# Patient Record
Sex: Male | Born: 1965 | Race: White | Hispanic: No | Marital: Married | Smoking: Never smoker
Health system: Southern US, Community
[De-identification: ages and names within clinical notes are randomized; demographics above are authoritative.]

## PROBLEM LIST (undated history)

## (undated) HISTORY — PX: TONSILLECTOMY: SUR1361

---

## 2011-06-08 ENCOUNTER — Emergency Department: Payer: Self-pay

## 2011-06-15 ENCOUNTER — Ambulatory Visit: Payer: Self-pay | Admitting: Internal Medicine

## 2016-05-17 ENCOUNTER — Ambulatory Visit: Payer: BLUE CROSS/BLUE SHIELD | Admitting: Anesthesiology

## 2016-05-17 ENCOUNTER — Encounter
Admission: RE | Disposition: A | Discharge status: Home / Self Care | Payer: Self-pay | Source: Ambulatory Visit | Attending: Gastroenterology

## 2016-05-17 ENCOUNTER — Encounter: Payer: Self-pay | Admitting: Anesthesiology

## 2016-05-17 ENCOUNTER — Ambulatory Visit
Admission: RE | Admit: 2016-05-17 | Discharge: 2016-05-17 | Disposition: A | Discharge status: Home / Self Care | Payer: BLUE CROSS/BLUE SHIELD | Source: Ambulatory Visit | Attending: Gastroenterology | Admitting: Gastroenterology

## 2016-05-17 DIAGNOSIS — Z1211 Encounter for screening for malignant neoplasm of colon: Secondary | ICD-10-CM | POA: Diagnosis present

## 2016-05-17 DIAGNOSIS — Z7982 Long term (current) use of aspirin: Secondary | ICD-10-CM | POA: Insufficient documentation

## 2016-05-17 DIAGNOSIS — K640 First degree hemorrhoids: Secondary | ICD-10-CM | POA: Insufficient documentation

## 2016-05-17 DIAGNOSIS — E669 Obesity, unspecified: Secondary | ICD-10-CM | POA: Diagnosis not present

## 2016-05-17 HISTORY — PX: COLONOSCOPY WITH PROPOFOL: SHX5780

## 2016-05-17 SURGERY — COLONOSCOPY WITH PROPOFOL
Anesthesia: General

## 2016-05-17 MED ORDER — SODIUM CHLORIDE 0.9 % IV SOLN: INTRAVENOUS | Status: DC

## 2016-05-17 MED ORDER — SODIUM CHLORIDE 0.9 % IV SOLN
INTRAVENOUS | Status: DC
  Administered 2016-05-17: 1000 mL via INTRAVENOUS

## 2016-05-17 MED ORDER — MIDAZOLAM HCL 2 MG/2ML IJ SOLN
INTRAMUSCULAR | Status: DC | PRN
  Administered 2016-05-17: 1 mg via INTRAVENOUS

## 2016-05-17 MED ORDER — FENTANYL CITRATE (PF) 100 MCG/2ML IJ SOLN
INTRAMUSCULAR | Status: DC | PRN
  Administered 2016-05-17: 50 ug via INTRAVENOUS

## 2016-05-17 MED ORDER — PROPOFOL 500 MG/50ML IV EMUL
INTRAVENOUS | Status: DC | PRN
  Administered 2016-05-17: 120 ug/kg/min via INTRAVENOUS

## 2016-05-17 NOTE — Anesthesia Preprocedure Evaluation (Signed)
Anesthesia Evaluation  Patient identified by MRN, date of birth, ID band Patient awake    Reviewed: Allergy & Precautions, NPO status , Patient's Chart, lab work & pertinent test results, reviewed documented beta blocker date and time   Airway Mallampati: III  TM Distance: >3 FB     Dental  (+) Chipped   Pulmonary           Cardiovascular      Neuro/Psych    GI/Hepatic   Endo/Other    Renal/GU      Musculoskeletal   Abdominal   Peds  Hematology   Anesthesia Other Findings Obese.  Reproductive/Obstetrics                             Anesthesia Physical Anesthesia Plan  ASA: II  Anesthesia Plan: General   Post-op Pain Management:    Induction: Intravenous  Airway Management Planned: Nasal Cannula  Additional Equipment:   Intra-op Plan:   Post-operative Plan:   Informed Consent: I have reviewed the patients History and Physical, chart, labs and discussed the procedure including the risks, benefits and alternatives for the proposed anesthesia with the patient or authorized representative who has indicated his/her understanding and acceptance.     Plan Discussed with: CRNA  Anesthesia Plan Comments:         Anesthesia Quick Evaluation

## 2016-05-17 NOTE — Transfer of Care (Signed)
Immediate Anesthesia Transfer of Care Note  Patient: Hector PapRonald D Wingrove Jr.  Procedure(s) Performed: Procedure(s): COLONOSCOPY WITH PROPOFOL (N/A)  Patient Location: PACU  Anesthesia Type:General  Level of Consciousness: awake and sedated  Airway & Oxygen Therapy: Patient Spontanous Breathing and Patient connected to nasal cannula oxygen  Post-op Assessment: Report given to RN and Post -op Vital signs reviewed and stable  Post vital signs: Reviewed  Last Vitals:  Filed Vitals:   05/17/16 0738  BP: 128/89  Pulse: 71  Temp: 35.9 C  Resp: 16    Last Pain: There were no vitals filed for this visit.       Complications: No apparent anesthesia complications

## 2016-05-17 NOTE — H&P (Signed)
Outpatient short stay form Pre-procedure 05/17/2016 8:42 AM Christena DeemMartin U Kaysen Sefcik MD  Primary Physician: Dr. Windle GuardWilson Elkins  Reason for visit:  Colonoscopy  History of present illness:  Patient is a 50 year old male presenting today for colon cancer screening. He's never had a colonoscopy before. He takes no current aspirin products. He takes no blood thinning agents. He tolerated his prep well.    Current facility-administered medications:  .  0.9 %  sodium chloride infusion, , Intravenous, Continuous, Christena DeemMartin U Mihailo Sage, MD, Last Rate: 20 mL/hr at 05/17/16 0749, 1,000 mL at 05/17/16 0749 .  0.9 %  sodium chloride infusion, , Intravenous, Continuous, Christena DeemMartin U Kyriana Yankee, MD  Prescriptions prior to admission  Medication Sig Dispense Refill Last Dose  . aspirin (ASPIRIN EC) 81 MG EC tablet Take 81 mg by mouth daily. Swallow whole.   Past Week at Unknown time  . Multiple Vitamin (MULTIVITAMIN) tablet Take 1 tablet by mouth daily.   Past Week at Unknown time     No Known Allergies   History reviewed. No pertinent past medical history.  Review of systems:      Physical Exam    Heart and lungs: Regular rate and rhythm without rub or gallop, lungs are bilaterally clear.    HEENT: Normocephalic atraumatic eyes are anicteric    Other:     Pertinant exam for procedure: Soft nontender nondistended bowel sounds positive normoactive.    Planned proceedures: Colonoscopy and indicated procedures. I have discussed the risks benefits and complications of procedures to include not limited to bleeding, infection, perforation and the risk of sedation and the patient wishes to proceed.    Christena DeemMartin U Jimmy Plessinger, MD Gastroenterology 05/17/2016  8:42 AM

## 2016-05-17 NOTE — Anesthesia Procedure Notes (Signed)
Performed by: COOK-MARTIN, Dima Ferrufino Pre-anesthesia Checklist: Patient identified, Emergency Drugs available, Suction available, Patient being monitored and Timeout performed Patient Re-evaluated:Patient Re-evaluated prior to inductionOxygen Delivery Method: Nasal cannula Preoxygenation: Pre-oxygenation with 100% oxygen Intubation Type: IV induction Placement Confirmation: CO2 detector and positive ETCO2       

## 2016-05-17 NOTE — Anesthesia Postprocedure Evaluation (Signed)
Anesthesia Post Note  Patient: Berniece PapRonald D Nephew Jr.  Procedure(s) Performed: Procedure(s) (LRB): COLONOSCOPY WITH PROPOFOL (N/A)  Patient location during evaluation: Endoscopy Anesthesia Type: General Level of consciousness: awake and alert Pain management: pain level controlled Vital Signs Assessment: post-procedure vital signs reviewed and stable Respiratory status: spontaneous breathing, nonlabored ventilation, respiratory function stable and patient connected to nasal cannula oxygen Cardiovascular status: blood pressure returned to baseline and stable Postop Assessment: no signs of nausea or vomiting Anesthetic complications: no    Last Vitals:  Filed Vitals:   05/17/16 0930 05/17/16 0940  BP: 121/86 120/93  Pulse: 71 57  Temp:    Resp: 16 16    Last Pain: There were no vitals filed for this visit.               Tighe Gitto S

## 2016-05-17 NOTE — Op Note (Signed)
[facility] Gastroenterology Patient Name: Gaylan Fauver Procedure Date: 05/17/2016 8:45 AM MRN: [id number] Account #: [id number] Date of Birth: 1966-02-09 Admit Type: Outpatient Age: 50 Room: [facility] ENDO ROOM 3 Gender: Male Note Status: Finalized Procedure:            Colonoscopy Indications:          Screening for colorectal malignant neoplasm, This is                        the patient's first colonoscopy Providers:            Christena Deem, MD Referring MD:         Kaleen Mask (Referring MD) Medicines:            Monitored Anesthesia Care Complications:        No immediate complications. Procedure:            Pre-Anesthesia Assessment:                       - ASA Grade Assessment: II - A patient with mild                        systemic disease.                       After obtaining informed consent, the colonoscope was                        passed under direct vision. Throughout the procedure,                        the patient's blood pressure, pulse, and oxygen                        saturations were monitored continuously. The                        Colonoscope was introduced through the anus and                        advanced to the the cecum, identified by appendiceal                        orifice and ileocecal valve. The colonoscopy was                        performed without difficulty. The patient tolerated the                        procedure well. The quality of the bowel preparation                        was good. Findings:      The colon (entire examined portion) appeared normal.      Non-bleeding internal hemorrhoids were found during retroflexion. The       hemorrhoids were small and Grade I (internal hemorrhoids that do not       prolapse).      The digital rectal exam was normal. Impression:           - The entire examined colon is normal.                       -  Non-bleeding internal hemorrhoids.                       - No  specimens collected. Recommendation:       - Discharge patient to home. Procedure Code(s):    --- Professional ---                       [phone], Colonoscopy, flexible; diagnostic, including                        collection of specimen(s) by brushing or washing, when                        performed (separate procedure) Diagnosis Code(s):    --- Professional ---                       Z12.11, Encounter for screening for malignant neoplasm                        of colon                       K64.0, First degree hemorrhoids CPT copyright 2016 American Medical Association. All rights reserved. The codes documented in this report are preliminary and upon coder review may  be revised to meet current compliance requirements. Christena DeemMartin U Skulskie, MD 05/17/2016 9:08:16 AM This report has been signed electronically. Number of Addenda: 0 Note Initiated On: 05/17/2016 8:45 AM Scope Withdrawal Time: 0 hours 6 minutes 48 seconds  Total Procedure Duration: 0 hours 15 minutes 44 seconds       [facility]

## 2016-05-18 ENCOUNTER — Encounter: Payer: Self-pay | Admitting: Gastroenterology

## 2016-07-01 ENCOUNTER — Encounter: Payer: Self-pay | Admitting: Sports Medicine

## 2016-07-01 ENCOUNTER — Ambulatory Visit (INDEPENDENT_AMBULATORY_CARE_PROVIDER_SITE_OTHER): Payer: Managed Care, Other (non HMO) | Admitting: Sports Medicine

## 2016-07-01 DIAGNOSIS — B351 Tinea unguium: Secondary | ICD-10-CM | POA: Diagnosis not present

## 2016-07-01 DIAGNOSIS — M79674 Pain in right toe(s): Secondary | ICD-10-CM

## 2016-07-01 DIAGNOSIS — L600 Ingrowing nail: Secondary | ICD-10-CM | POA: Diagnosis not present

## 2016-07-01 NOTE — Patient Instructions (Signed)

## 2016-07-01 NOTE — Progress Notes (Signed)
Patient ID: Berniece Pap., male   DOB: [birth year], 50 y.o.   MRN: [id number] Subjective: Tishawn Friedhoff. is a 50 y.o. male patient presents to office today complaining of a painful incurvated, swollen medial and lateral nail borders of the first toe on the Right foot. This has been present for a few weeks; reports that his PCP treated him for nail fungus with oral Lamisil and since nail has started to grow in skin and hurt. Patient denies fever/chills/nausea/vomitting/any other related constitutional symptoms at this time.  There are no active problems to display for this patient.   Current Outpatient Prescriptions on File Prior to Visit  Medication Sig Dispense Refill  . aspirin (ASPIRIN EC) 81 MG EC tablet Take 81 mg by mouth daily. Swallow whole.    . Multiple Vitamin (MULTIVITAMIN) tablet Take 1 tablet by mouth daily.     No current facility-administered medications on file prior to visit.    No Known Allergies  Objective:  There were no vitals filed for this visit.  General: Well developed, nourished, in no acute distress, alert and oriented x3   Dermatology: Skin is warm, dry and supple bilateral. RIght hallux nail appears to be  severely incurvated with hyperkeratosis formation at the distal aspects of  the medial and lateral nail borders. (-) Erythema. (+) Edema. (-) serosanguous  drainage present. The remaining nails appear unremarkable at this time, free from acute ingrowing. There are no open sores, lesions or other signs of infection present.  Vascular: Dorsalis Pedis artery and Posterior Tibial artery pedal pulses are 2/4 bilateral with immedate capillary fill time. Pedal hair growth present. No lower extremity edema.   Neruologic: Grossly intact via light touch bilateral.  Musculoskeletal: Tenderness to palpation of the Right hallux medial and lateral nail fold(s). Muscular strength within normal limits in all groups bilateral.   Assesement and Plan: Problem List  Items Addressed This Visit    None    Visit Diagnoses    Ingrown nail    -  Primary    Toe pain, right        Nail fungus        Previously treated with Lamisil last dose March 2017    Relevant Medications    terbinafine (LAMISIL) 250 MG tablet      -Discussed treatment alternatives and plan of care; Explained permanent/temporary nail avulsion and post procedure course to patient. Patient opt for PNA Right hallux medial and lateral nail folds. - After a verbal consent, injected 6 ml of a 50:50 mixture of 2% plain  lidocaine and 0.5% plain marcaine in a normal hallux block fashion. Next, a  betadine prep was performed. Anesthesia was tested and found to be appropriate.  The offending Right hallux medial and lateral nail borders were then incised from the hyponychium to the epinychium. The offending nail borders were removed and cleared from the field. The areas were curretted for any remaining nail or spicules. Phenol application performed and the area was then flushed with alcohol and dressed with antibiotic cream and a dry sterile dressing. -Patient was instructed to leave the dressing intact for today and begin soaking  in a weak solution of betadine and water tomorrow. Patient was instructed to  soak for 15 minutes each day and apply neosporin and a gauze or bandaid dressing each day. -Patient was instructed to monitor the toe for signs of infection and return to office if toe becomes red, hot or swollen. -Advised ice, elevation, and  tylenol or motrin if needed for pain.  -Patient is to return in 1 week for follow up care or sooner if problems arise.  Asencion Islamitorya Kalonji Zurawski, DPM

## 2016-07-15 ENCOUNTER — Ambulatory Visit (INDEPENDENT_AMBULATORY_CARE_PROVIDER_SITE_OTHER): Payer: Managed Care, Other (non HMO) | Admitting: Sports Medicine

## 2016-07-15 ENCOUNTER — Encounter: Payer: Self-pay | Admitting: Sports Medicine

## 2016-07-15 DIAGNOSIS — Z9889 Other specified postprocedural states: Secondary | ICD-10-CM

## 2016-07-15 DIAGNOSIS — M79674 Pain in right toe(s): Secondary | ICD-10-CM

## 2016-07-15 NOTE — Patient Instructions (Signed)

## 2016-07-15 NOTE — Progress Notes (Signed)
Subjective: Hector Anderson. is a 50 y.o. male patient returns to office today for follow up evaluation after having Right Hallux medial and lateral permanent nail avulsions performed on 07-01-16. Patient has been soaking using betadine and applying topical antibiotic covered with bandaid daily. Patient denies fever/chills/nausea/vomitting/any other related constitutional symptoms at this time.  There are no active problems to display for this patient.   Current Outpatient Prescriptions on File Prior to Visit  Medication Sig Dispense Refill  . aspirin (ASPIRIN EC) 81 MG EC tablet Take 81 mg by mouth daily. Swallow whole.    . Multiple Vitamin (MULTIVITAMIN) tablet Take 1 tablet by mouth daily.    Marland Kitchen omeprazole (PRILOSEC) 20 MG capsule Take by mouth.    . Red Yeast Rice Extract 600 MG CAPS Take by mouth.    . terbinafine (LAMISIL) 250 MG tablet Take by mouth.     No current facility-administered medications on file prior to visit.     No Known Allergies  Objective:  General: Well developed, nourished, in no acute distress, alert and oriented x3   Dermatology: Skin is warm, dry and supple bilateral. Right hallux medial and lateral nail beds appears to be clean, dry, with mild granular tissue and surrounding eschar/scab. (-) Erythema. (-) Edema. (-) serosanguous drainage present. The remaining nails appear unremarkable at this time, free from ingrowing. There are no other lesions or other signs of infection present.  Neurovascular status: Intact. No lower extremity swelling; No pain with calf compression bilateral.  Musculoskeletal: Decreased tenderness to palpation of the Right hallux nail fold(s). Muscular strength within normal limits bilateral.   Assesement and Plan: Problem List Items Addressed This Visit    None    Visit Diagnoses    S/P nail surgery    -  Primary   Toe pain, right          -Examined patient  -Cleansed right hallux medial and lateral nail folds and gently  scrubbed with peroxide and q-tip/curetted away eschar at site and applied antibiotic cream covered with bandaid.  -Discussed plan of care with patient. -Patient to now begin soaking in a weak solution of Epsom salt and warm water. Patient was instructed to soak for 15-20 minutes each day until the toe appears normal and there is no drainage, redness, tenderness, or swelling at the procedure site, and apply neosporin and a gauze or bandaid dressing each day as needed. May leave open to air at night. -Educated patient on long term care after nail surgery. -Patient was instructed to monitor the toe for reoccurrence and signs of infection; Patient advised to return to office or go to ER if toe becomes red, hot or swollen. -Patient is to return as needed or sooner if problems arise.  Asencion Islam, DPM

## 2017-02-01 ENCOUNTER — Other Ambulatory Visit (HOSPITAL_COMMUNITY): Payer: Self-pay | Admitting: Family Medicine

## 2017-02-01 DIAGNOSIS — R1319 Other dysphagia: Secondary | ICD-10-CM

## 2017-02-02 ENCOUNTER — Ambulatory Visit (HOSPITAL_COMMUNITY)
Admission: RE | Admit: 2017-02-02 | Discharge: 2017-02-02 | Disposition: A | Discharge status: Home / Self Care | Payer: Managed Care, Other (non HMO) | Source: Ambulatory Visit | Attending: Family Medicine | Admitting: Family Medicine

## 2017-02-02 ENCOUNTER — Ambulatory Visit (HOSPITAL_COMMUNITY): Payer: BLUE CROSS/BLUE SHIELD

## 2017-02-02 DIAGNOSIS — R1319 Other dysphagia: Secondary | ICD-10-CM

## 2017-02-10 ENCOUNTER — Other Ambulatory Visit: Payer: Self-pay | Admitting: Family Medicine

## 2017-02-28 ENCOUNTER — Other Ambulatory Visit: Payer: Self-pay | Admitting: Family Medicine

## 2017-02-28 DIAGNOSIS — R1310 Dysphagia, unspecified: Secondary | ICD-10-CM

## 2017-03-07 ENCOUNTER — Ambulatory Visit
Admission: RE | Admit: 2017-03-07 | Discharge: 2017-03-07 | Disposition: A | Discharge status: Home / Self Care | Payer: Managed Care, Other (non HMO) | Source: Ambulatory Visit | Attending: Family Medicine | Admitting: Family Medicine

## 2017-03-07 DIAGNOSIS — R1310 Dysphagia, unspecified: Secondary | ICD-10-CM

## 2018-03-06 ENCOUNTER — Ambulatory Visit: Admission: RE | Admit: 2018-03-06 | Payer: 59 | Source: Ambulatory Visit | Admitting: Internal Medicine

## 2018-03-06 ENCOUNTER — Encounter: Admission: RE | Payer: Self-pay | Source: Ambulatory Visit

## 2018-03-06 SURGERY — ESOPHAGOGASTRODUODENOSCOPY (EGD) WITH PROPOFOL
Anesthesia: General

## 2018-04-02 ENCOUNTER — Other Ambulatory Visit
Admission: RE | Admit: 2018-04-02 | Discharge: 2018-04-02 | Disposition: A | Discharge status: Home / Self Care | Payer: 59 | Source: Ambulatory Visit | Attending: Gastroenterology | Admitting: Gastroenterology

## 2018-04-02 DIAGNOSIS — R197 Diarrhea, unspecified: Secondary | ICD-10-CM | POA: Insufficient documentation

## 2018-04-02 DIAGNOSIS — J312 Chronic pharyngitis: Secondary | ICD-10-CM | POA: Diagnosis present

## 2018-04-02 LAB — GASTROINTESTINAL PANEL BY PCR, STOOL (REPLACES STOOL CULTURE)
ADENOVIRUS F40/41: NOT DETECTED
Astrovirus: NOT DETECTED
CAMPYLOBACTER SPECIES: NOT DETECTED
CYCLOSPORA CAYETANENSIS: NOT DETECTED
Cryptosporidium: NOT DETECTED
ENTEROAGGREGATIVE E COLI (EAEC): DETECTED — AB
ENTEROPATHOGENIC E COLI (EPEC): NOT DETECTED
ENTEROTOXIGENIC E COLI (ETEC): NOT DETECTED
Entamoeba histolytica: NOT DETECTED
GIARDIA LAMBLIA: NOT DETECTED
Norovirus GI/GII: NOT DETECTED
PLESIMONAS SHIGELLOIDES: NOT DETECTED
Rotavirus A: NOT DETECTED
Salmonella species: NOT DETECTED
Sapovirus (I, II, IV, and V): NOT DETECTED
Shiga like toxin producing E coli (STEC): NOT DETECTED
Shigella/Enteroinvasive E coli (EIEC): NOT DETECTED
VIBRIO SPECIES: NOT DETECTED
Vibrio cholerae: NOT DETECTED
YERSINIA ENTEROCOLITICA: NOT DETECTED

## 2018-04-02 LAB — C DIFFICILE QUICK SCREEN W PCR REFLEX
C DIFFICILE (CDIFF) INTERP: NOT DETECTED
C Diff antigen: NEGATIVE
C Diff toxin: NEGATIVE

## 2018-05-29 ENCOUNTER — Ambulatory Visit
Admission: RE | Admit: 2018-05-29 | Discharge: 2018-05-29 | Disposition: A | Discharge status: Home / Self Care | Payer: 59 | Source: Ambulatory Visit | Attending: Internal Medicine | Admitting: Internal Medicine

## 2018-05-29 ENCOUNTER — Ambulatory Visit: Payer: 59 | Admitting: Anesthesiology

## 2018-05-29 ENCOUNTER — Encounter: Payer: Self-pay | Admitting: *Deleted

## 2018-05-29 ENCOUNTER — Encounter
Admission: RE | Disposition: A | Discharge status: Home / Self Care | Payer: Self-pay | Source: Ambulatory Visit | Attending: Internal Medicine

## 2018-05-29 DIAGNOSIS — Z6839 Body mass index (BMI) 39.0-39.9, adult: Secondary | ICD-10-CM | POA: Insufficient documentation

## 2018-05-29 DIAGNOSIS — K219 Gastro-esophageal reflux disease without esophagitis: Secondary | ICD-10-CM | POA: Insufficient documentation

## 2018-05-29 DIAGNOSIS — Z79899 Other long term (current) drug therapy: Secondary | ICD-10-CM | POA: Diagnosis not present

## 2018-05-29 DIAGNOSIS — R1310 Dysphagia, unspecified: Secondary | ICD-10-CM | POA: Diagnosis not present

## 2018-05-29 DIAGNOSIS — Z7982 Long term (current) use of aspirin: Secondary | ICD-10-CM | POA: Diagnosis not present

## 2018-05-29 DIAGNOSIS — J029 Acute pharyngitis, unspecified: Secondary | ICD-10-CM | POA: Insufficient documentation

## 2018-05-29 DIAGNOSIS — K2950 Unspecified chronic gastritis without bleeding: Secondary | ICD-10-CM | POA: Diagnosis not present

## 2018-05-29 HISTORY — PX: ESOPHAGOGASTRODUODENOSCOPY (EGD) WITH PROPOFOL: SHX5813

## 2018-05-29 SURGERY — ESOPHAGOGASTRODUODENOSCOPY (EGD) WITH PROPOFOL
Anesthesia: General

## 2018-05-29 MED ORDER — PROPOFOL 500 MG/50ML IV EMUL
INTRAVENOUS | Status: AC
  Filled 2018-05-29: qty 50

## 2018-05-29 MED ORDER — MIDAZOLAM HCL 2 MG/2ML IJ SOLN
INTRAMUSCULAR | Status: DC | PRN
  Administered 2018-05-29: 2 mg via INTRAVENOUS

## 2018-05-29 MED ORDER — SODIUM CHLORIDE 0.9 % IV SOLN
INTRAVENOUS | Status: DC
  Administered 2018-05-29: 1000 mL via INTRAVENOUS

## 2018-05-29 MED ORDER — LIDOCAINE HCL (PF) 2 % IJ SOLN
INTRAMUSCULAR | Status: AC
  Filled 2018-05-29: qty 10

## 2018-05-29 MED ORDER — LIDOCAINE HCL (CARDIAC) PF 100 MG/5ML IV SOSY
PREFILLED_SYRINGE | INTRAVENOUS | Status: DC | PRN
  Administered 2018-05-29: 30 mg via INTRAVENOUS

## 2018-05-29 MED ORDER — FENTANYL CITRATE (PF) 100 MCG/2ML IJ SOLN
INTRAMUSCULAR | Status: AC
  Filled 2018-05-29: qty 2

## 2018-05-29 MED ORDER — FENTANYL CITRATE (PF) 100 MCG/2ML IJ SOLN
INTRAMUSCULAR | Status: DC | PRN
  Administered 2018-05-29 (×2): 25 ug via INTRAVENOUS
  Administered 2018-05-29: 50 ug via INTRAVENOUS

## 2018-05-29 MED ORDER — PROPOFOL 500 MG/50ML IV EMUL
INTRAVENOUS | Status: DC | PRN
  Administered 2018-05-29: 120 ug/kg/min via INTRAVENOUS

## 2018-05-29 MED ORDER — MIDAZOLAM HCL 2 MG/2ML IJ SOLN
INTRAMUSCULAR | Status: AC
  Filled 2018-05-29: qty 2

## 2018-05-29 NOTE — Interval H&P Note (Signed)
History and Physical Interval Note:  05/29/2018 12:09 PM  Hector Paponald D Fiumara Jr.  has presented today for surgery, with the diagnosis of GERD  The various methods of treatment have been discussed with the patient and family. After consideration of risks, benefits and other options for treatment, the patient has consented to  Procedure(s): ESOPHAGOGASTRODUODENOSCOPY (EGD) WITH PROPOFOL (N/A) as a surgical intervention .  The patient's history has been reviewed, patient examined, no change in status, stable for surgery.  I have reviewed the patient's chart and labs.  Questions were answered to the patient's satisfaction.     Sourisoledo, Chelseaeodoro

## 2018-05-29 NOTE — Op Note (Signed)
[facility] Gastroenterology Patient Name: Hector Anderson Procedure Date: 05/29/2018 11:46 AM MRN: [id number] Account #: [id number] Date of Birth: 10-Jun-1966 Admit Type: Outpatient Age: 52 Room: [facility] ENDO ROOM 2 Gender: Male Note Status: Finalized Procedure:            Upper GI endoscopy Indications:          Dysphagia, Esophageal reflux, Sore throat Providers:            Boykin Nearing. Norma Fredrickson MD, MD Referring MD:         Kaleen Mask (Referring MD) Medicines:            Propofol per Anesthesia Complications:        No immediate complications. Procedure:            Pre-Anesthesia Assessment:                       - The risks and benefits of the procedure and the                        sedation options and risks were discussed with the                        patient. All questions were answered and informed                        consent was obtained.                       - Patient identification and proposed procedure were                        verified prior to the procedure by the nurse. The                        procedure was verified in the procedure room.                       - ASA Grade Assessment: III - A patient with severe                        systemic disease.                       - After reviewing the risks and benefits, the patient                        was deemed in satisfactory condition to undergo the                        procedure.                       After obtaining informed consent, the endoscope was                        passed under direct vision. Throughout the procedure,                        the patient's blood pressure, pulse, and oxygen  saturations were monitored continuously. The Endoscope                        was introduced through the mouth, and advanced to the                        third part of duodenum. The upper GI endoscopy was                        accomplished without difficulty. The  patient tolerated                        the procedure well. Findings:      The Z-line was irregular and was found at the gastroesophageal junction.       Mucosa was biopsied with a cold forceps for histology. One specimen       bottle was sent to pathology. Biopsies were obtained from the proximal       and distal esophagus with cold forceps for histology of suspected       eosinophilic esophagitis.      The entire examined stomach was normal.      The examined duodenum was normal.      The exam was otherwise without abnormality. Impression:           - Z-line irregular, at the gastroesophageal junction.                        Biopsied.                       - Normal stomach.                       - Normal examined duodenum.                       - The examination was otherwise normal. Recommendation:       - Patient has a contact number available for                        emergencies. The signs and symptoms of potential                        delayed complications were discussed with the patient.                        Return to normal activities tomorrow. Written discharge                        instructions were provided to the patient.                       - Resume previous diet.                       - Continue present medications.                       - Await pathology results.                       - Return to physician assistant in 3 months.                       -  The findings and recommendations were discussed with                        the patient and their spouse. Procedure Code(s):    --- Professional ---                       [phone], Esophagogastroduodenoscopy, flexible, transoral;                        with biopsy, single or multiple Diagnosis Code(s):    --- Professional ---                       J02.9, Acute pharyngitis, unspecified                       K21.9, Gastro-esophageal reflux disease without                        esophagitis                        R13.10, Dysphagia, unspecified                       K22.8, Other specified diseases of esophagus CPT copyright 2017 American Medical Association. All rights reserved. The codes documented in this report are preliminary and upon coder review may  be revised to meet current compliance requirements. Stanton Kidney MD, MD 05/29/2018 1:17:11 PM This report has been signed electronically. Number of Addenda: 0 Note Initiated On: 05/29/2018 11:46 AM      [facility]

## 2018-05-29 NOTE — H&P (Signed)
Outpatient short stay form Pre-procedure 05/29/2018 11:57 AM Exavior Kimmons K. Norma Fredricksonoledo, M.D.  Primary Physician: Windle GuardWilson Elkins, M.D.  Reason for visit: Dysphagia, GERD.  History of present illness:  Patient with a history of GERD c/o nondescript dysphagia. No hemetemesis or abdominal pain.   No current facility-administered medications for this encounter.   Medications Prior to Admission  Medication Sig Dispense Refill Last Dose  . Misc Natural Products (OSTEO BI-FLEX JOINT SHIELD PO) Take by mouth daily.     Marland Kitchen. aspirin (ASPIRIN EC) 81 MG EC tablet Take 81 mg by mouth daily. Swallow whole.   Past Week at Unknown time  . Multiple Vitamin (MULTIVITAMIN) tablet Take 1 tablet by mouth daily.   Past Week at Unknown time  . omeprazole (PRILOSEC) 20 MG capsule Take by mouth.     . Red Yeast Rice Extract 600 MG CAPS Take by mouth.     . terbinafine (LAMISIL) 250 MG tablet Take by mouth.        No Known Allergies   No past medical history on file.  Review of systems:  Otherwise negative.    Physical Exam  Gen: Alert, oriented. Appears stated age.  HEENT: [place]/AT. PERRLA. Lungs: CTA, no wheezes. CV: RR nl S1, S2. Abd: soft, benign, no masses. BS+ Ext: No edema. Pulses 2+    Planned procedures: Proceed with colonoscopy. The patient understands the nature of the planned procedure, indications, risks, alternatives and potential complications including but not limited to bleeding, infection, perforation, damage to internal organs and possible oversedation/side effects from anesthesia. The patient agrees and gives consent to proceed.  Please refer to procedure notes for findings, recommendations and patient disposition/instructions.     Lucy Woolever K. Norma Fredricksonoledo, M.D. Gastroenterology 05/29/2018  11:57 AM

## 2018-05-29 NOTE — Anesthesia Post-op Follow-up Note (Signed)
Anesthesia QCDR form completed.        

## 2018-05-29 NOTE — Anesthesia Preprocedure Evaluation (Signed)
Anesthesia Evaluation  Patient identified by MRN, date of birth, ID band Patient awake    Reviewed: Allergy & Precautions, H&P , NPO status , Patient's Chart, lab work & pertinent test results, reviewed documented beta blocker date and time   Airway Mallampati: II   Neck ROM: full    Dental  (+) Poor Dentition   Pulmonary neg pulmonary ROS,    Pulmonary exam normal        Cardiovascular negative cardio ROS Normal cardiovascular exam Rhythm:regular Rate:Normal     Neuro/Psych negative neurological ROS  negative psych ROS   GI/Hepatic negative GI ROS, Neg liver ROS,   Endo/Other  negative endocrine ROSMorbid obesity  Renal/GU negative Renal ROS  negative genitourinary   Musculoskeletal   Abdominal   Peds  Hematology negative hematology ROS (+)   Anesthesia Other Findings History reviewed. No pertinent past medical history. Past Surgical History: 05/17/2016: COLONOSCOPY WITH PROPOFOL; N/A     Comment:  Procedure: COLONOSCOPY WITH PROPOFOL;  Surgeon: Christena DeemMartin U              Skulskie, MD;  [facility];  Service:               Endoscopy;  Laterality: N/A; No date: TONSILLECTOMY BMI    Body Mass Index:  39.13 kg/m     Reproductive/Obstetrics negative OB ROS                             Anesthesia Physical Anesthesia Plan  ASA: III  Anesthesia Plan: General   Post-op Pain Management:    Induction:   PONV Risk Score and Plan:   Airway Management Planned:   Additional Equipment:   Intra-op Plan:   Post-operative Plan:   Informed Consent: I have reviewed the patients History and Physical, chart, labs and discussed the procedure including the risks, benefits and alternatives for the proposed anesthesia with the patient or authorized representative who has indicated his/her understanding and acceptance.   Dental Advisory Given  Plan Discussed with: CRNA  Anesthesia  Plan Comments:         Anesthesia Quick Evaluation

## 2018-05-29 NOTE — Anesthesia Procedure Notes (Signed)
Performed by: Cook-Martin, Elta Angell Pre-anesthesia Checklist: Patient identified, Emergency Drugs available, Suction available, Patient being monitored and Timeout performed Patient Re-evaluated:Patient Re-evaluated prior to induction Oxygen Delivery Method: Nasal cannula Preoxygenation: Pre-oxygenation with 100% oxygen Induction Type: IV induction Airway Equipment and Method: Bite block Placement Confirmation: CO2 detector and positive ETCO2       

## 2018-05-29 NOTE — Transfer of Care (Signed)
Immediate Anesthesia Transfer of Care Note  Patient: Hector PapRonald D Sazama Jr.  Procedure(s) Performed: ESOPHAGOGASTRODUODENOSCOPY (EGD) WITH PROPOFOL (N/A )  Patient Location: PACU  Anesthesia Type:General  Level of Consciousness: awake and sedated  Airway & Oxygen Therapy: Patient Spontanous Breathing and Patient connected to nasal cannula oxygen  Post-op Assessment: Report given to RN and Post -op Vital signs reviewed and stable  Post vital signs: Reviewed and stable  Last Vitals:  Vitals Value Taken Time  BP    Temp    Pulse    Resp    SpO2      Last Pain:  Vitals:   05/29/18 1221  TempSrc: Tympanic  PainSc: 3       Patients Stated Pain Goal: 0 (05/29/18 1221)  Complications: No apparent anesthesia complications

## 2018-05-30 ENCOUNTER — Encounter: Payer: Self-pay | Admitting: Internal Medicine

## 2018-05-30 NOTE — Anesthesia Postprocedure Evaluation (Signed)
Anesthesia Post Note  Patient: Hector PapRonald D Shishido Jr.  Procedure(s) Performed: ESOPHAGOGASTRODUODENOSCOPY (EGD) WITH PROPOFOL (N/A )  Patient location during evaluation: Endoscopy Anesthesia Type: General Level of consciousness: awake and alert Pain management: pain level controlled Vital Signs Assessment: post-procedure vital signs reviewed and stable Respiratory status: spontaneous breathing, nonlabored ventilation and respiratory function stable Cardiovascular status: blood pressure returned to baseline and stable Postop Assessment: no apparent nausea or vomiting Anesthetic complications: no     Last Vitals:  Vitals:   05/29/18 1330 05/29/18 1340  BP: 132/83 (!) 141/96  Pulse: 73 77  Resp: 13 17  Temp:    SpO2: 93% 94%    Last Pain:  Vitals:   05/30/18 0749  TempSrc:   PainSc: 0-No pain                 Christia ReadingScott T Milfred Krammes

## 2018-05-31 LAB — SURGICAL PATHOLOGY

## 2019-10-22 ENCOUNTER — Encounter: Payer: Self-pay | Admitting: Podiatry

## 2019-10-22 ENCOUNTER — Other Ambulatory Visit: Payer: Self-pay

## 2019-10-22 ENCOUNTER — Ambulatory Visit (INDEPENDENT_AMBULATORY_CARE_PROVIDER_SITE_OTHER): Payer: BC Managed Care – PPO

## 2019-10-22 ENCOUNTER — Ambulatory Visit: Payer: Managed Care, Other (non HMO) | Admitting: Podiatry

## 2019-10-22 DIAGNOSIS — M722 Plantar fascial fibromatosis: Secondary | ICD-10-CM | POA: Diagnosis not present

## 2019-10-22 DIAGNOSIS — M76821 Posterior tibial tendinitis, right leg: Secondary | ICD-10-CM | POA: Diagnosis not present

## 2019-10-22 MED ORDER — GABAPENTIN 100 MG PO CAPS: 100.0000 mg | ORAL_CAPSULE | Freq: Two times a day (BID) | ORAL | 1 refills | Status: DC

## 2019-10-23 ENCOUNTER — Other Ambulatory Visit: Payer: BC Managed Care – PPO | Admitting: Orthotics

## 2019-10-25 NOTE — Progress Notes (Signed)
    HPI: 53 y.o. male presenting today, referred by Dr. Gershon Mussel, with a chief complaint of constant, sharp pain of the right heel that began about one year ago. He reports associated Achilles pain. Standing after being seated for a while increases the pain. He has received injections, used a brace, had physical therapy and taken Meloxicam for treatment. Patient is here for further evaluation and treatment.   No past medical history on file.     Physical Exam: General: The patient is alert and oriented x3 in no acute distress.  Dermatology: Skin is warm, dry and supple bilateral lower extremities. Negative for open lesions or macerations.  Vascular: Palpable pedal pulses bilaterally. No edema or erythema noted. Capillary refill within normal limits.  Neurological: Epicritic and protective threshold grossly intact bilaterally.   Musculoskeletal Exam: Pain on palpation noted to the posterior tibial tendon of the right foot. Tenderness to palpation to the plantar aspect of the right heel along the plantar fascia. Range of motion within normal limits. Muscle strength 5/5 in all muscle groups bilateral lower extremities.  Radiographic Exam:  Normal osseous mineralization. Joint spaces preserved. No fracture or dislocation identified.    Assessment: 1. Posterior tibial tendinitis right 2. Plantar fasciitis right    Plan of Care:  1. Patient was evaluated. Radiographs were reviewed today. 2. Appointment with Liliane Channel, Pedorthist, for custom molded orthotics.  3. Prescription for Gabapentin 100 mg BID provided to patient.  4. Return to clinic as needed.    Edrick Kins, DPM [facility]  Dr. Edrick Kins, [address]                                        [place], [place] [address]                Office [phone]  Fax [phone]

## 2019-11-11 ENCOUNTER — Other Ambulatory Visit: Payer: Self-pay | Admitting: Family Medicine

## 2019-11-11 ENCOUNTER — Ambulatory Visit
Admission: RE | Admit: 2019-11-11 | Discharge: 2019-11-11 | Disposition: A | Discharge status: Home / Self Care | Payer: BC Managed Care – PPO | Source: Ambulatory Visit | Attending: Family Medicine | Admitting: Family Medicine

## 2019-11-11 DIAGNOSIS — R059 Cough, unspecified: Secondary | ICD-10-CM

## 2019-11-11 DIAGNOSIS — R05 Cough: Secondary | ICD-10-CM | POA: Diagnosis present

## 2019-11-11 DIAGNOSIS — U071 COVID-19: Secondary | ICD-10-CM

## 2019-11-20 ENCOUNTER — Other Ambulatory Visit: Payer: BC Managed Care – PPO | Admitting: Orthotics

## 2019-12-13 IMAGING — CR DG CHEST 2V
1 series · 2 of 2 positions shown · non-contrast
Comparison: June 08, 2011
COMPARISON: June 08, 2011

CLINICAL DATA: Cough.

EXAM:
CHEST - 2 VIEW

[Series 1: w chest pa · 0.14mm/px · 2 of 2 slices shown]
[im 1/2]
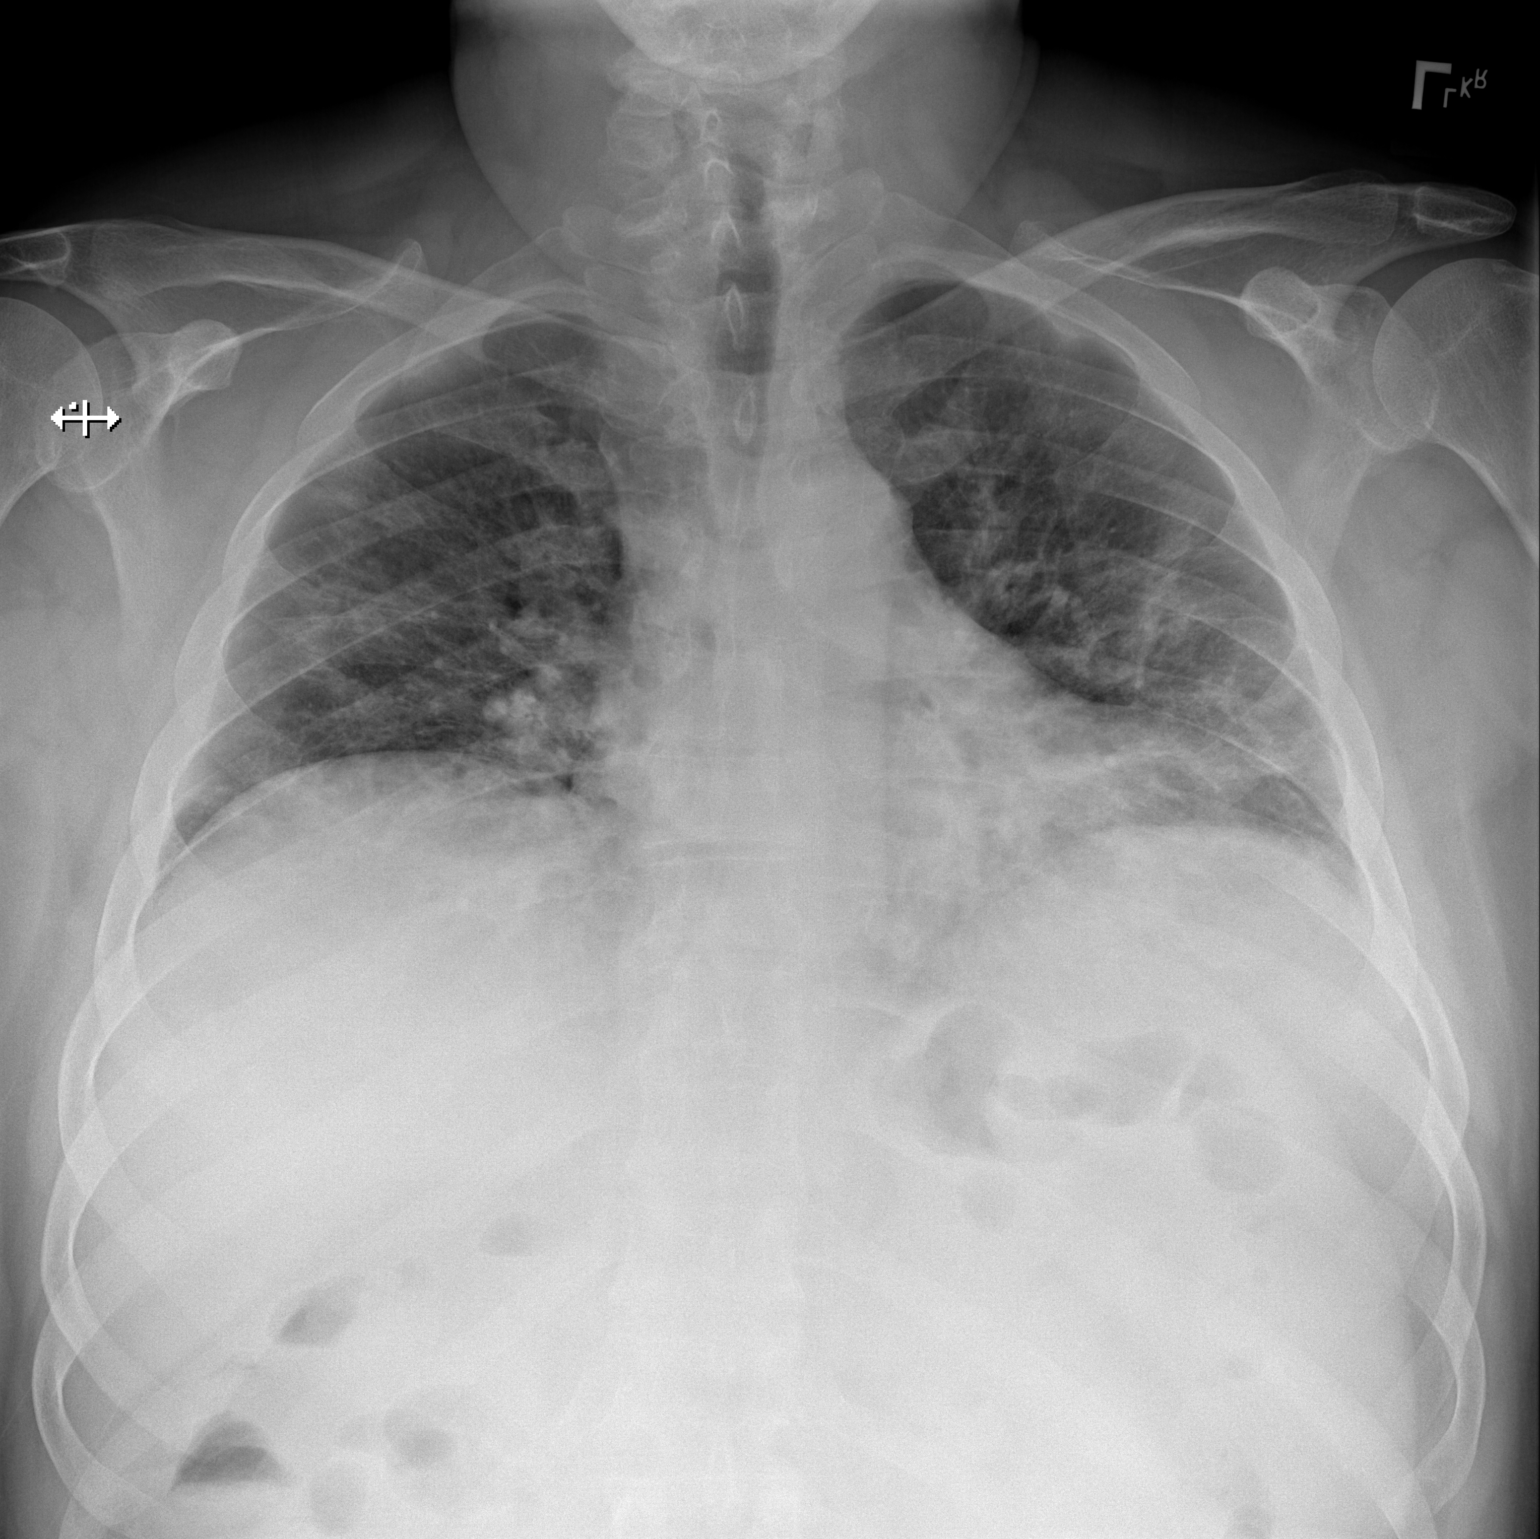
[im 2/2]
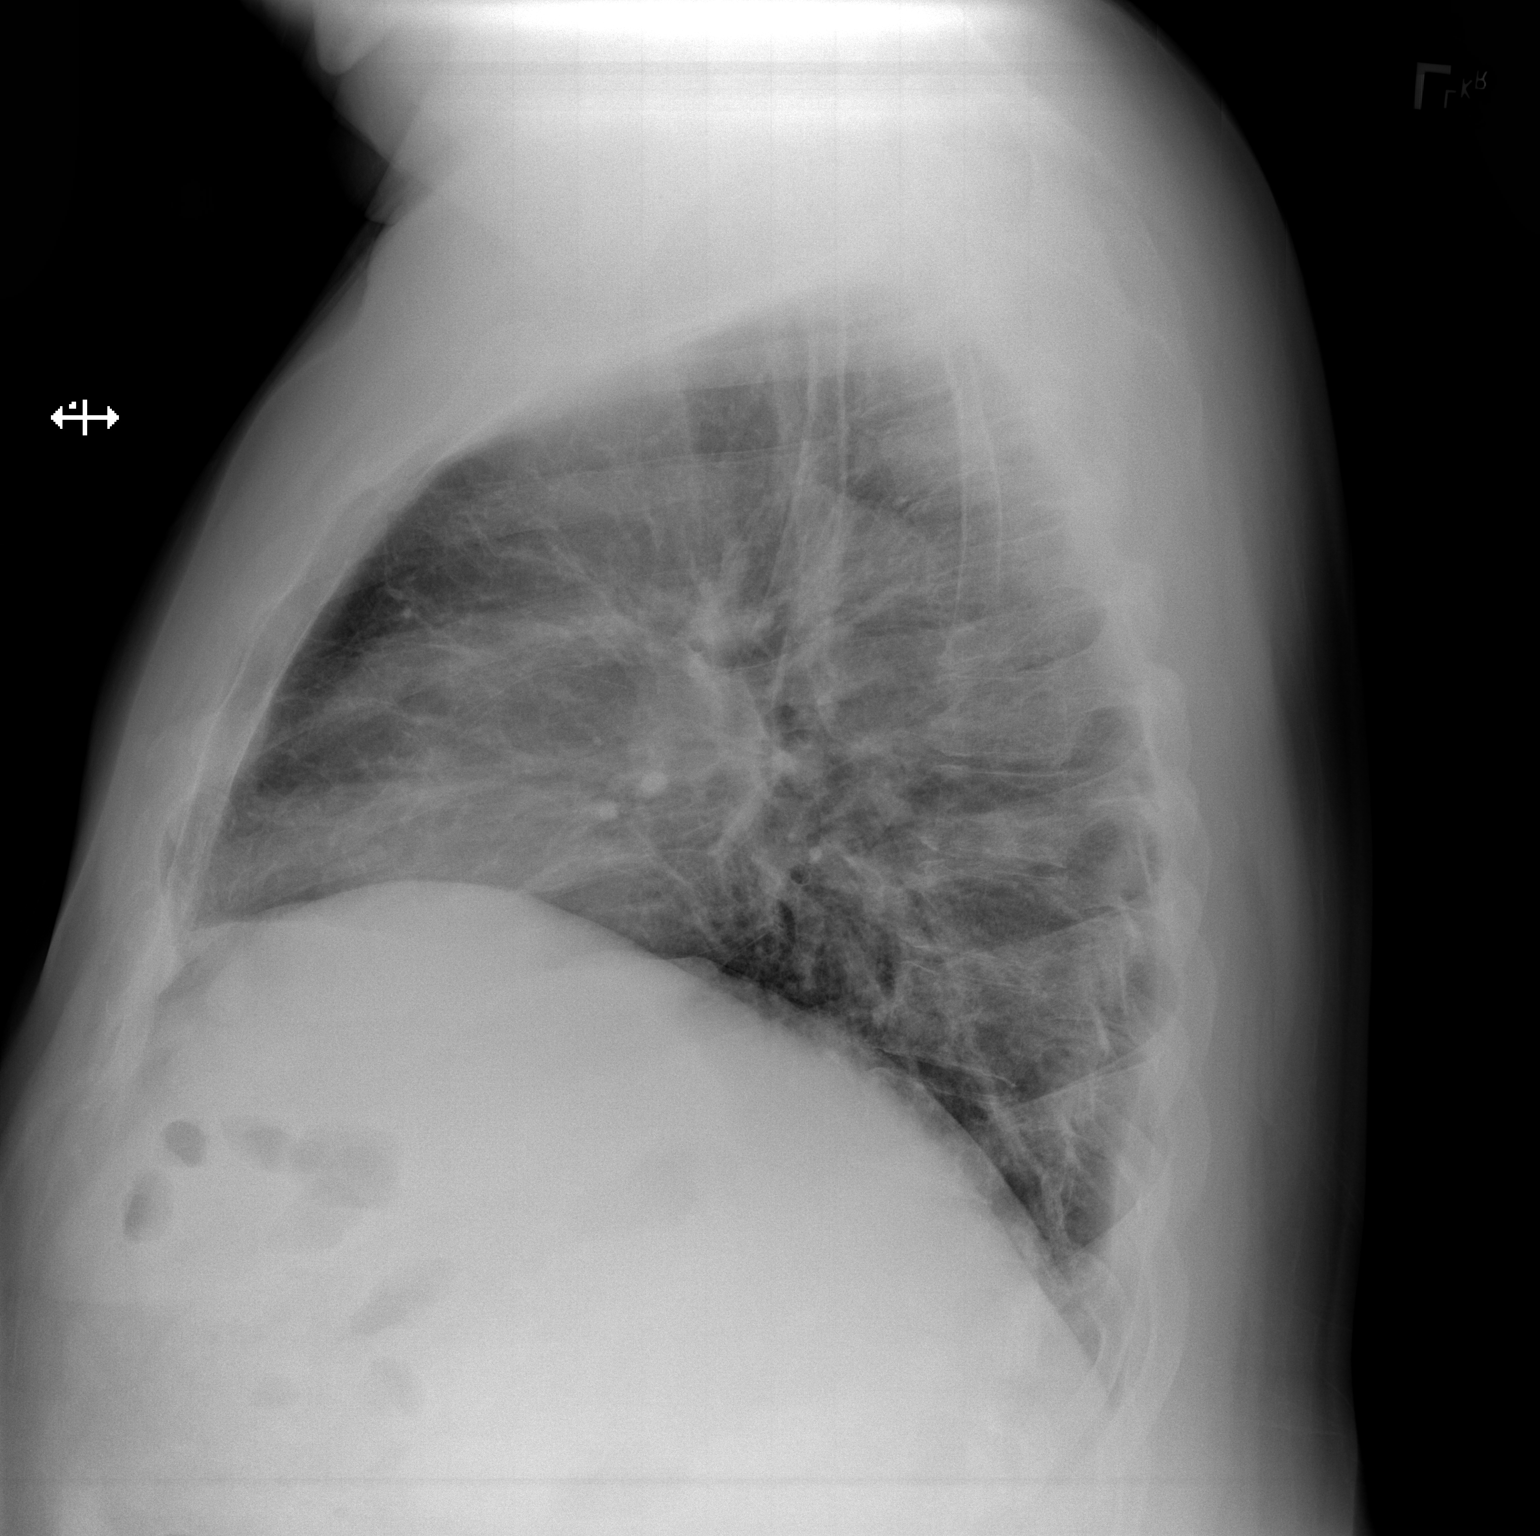

[2 of 2 positions shown; findings below may reference images not displayed]

FINDINGS: There is scattered bilateral airspace opacities. The lung volumes
are low. The heart size is relatively stable from prior study. There
is no pneumothorax. No large pleural effusion. No acute osseous
abnormality.
IMPRESSION: Multifocal airspace opacities concerning
FINDINGS: There is scattered bilateral airspace opacities. The lung volumes
are low. The heart size is relatively stable from prior study. There
is no pneumothorax. No large pleural effusion. No acute osseous
abnormality.
IMPRESSION: Multifocal airspace opacities bilaterally concerning for multifocal
pneumonia (viral or bacterial).

Low lung volumes.

## 2020-09-15 ENCOUNTER — Ambulatory Visit
Admission: RE | Admit: 2020-09-15 | Discharge: 2020-09-15 | Disposition: A | Discharge status: Home / Self Care | Payer: BC Managed Care – PPO | Source: Ambulatory Visit | Attending: Family Medicine | Admitting: Family Medicine

## 2020-09-15 ENCOUNTER — Other Ambulatory Visit: Payer: Self-pay | Admitting: Family Medicine

## 2020-09-15 DIAGNOSIS — R058 Other specified cough: Secondary | ICD-10-CM

## 2020-10-17 IMAGING — DX DG CHEST 2V
3 series · 3 of 3 positions shown · non-contrast
Comparison: 11/11/2019

CLINICAL DATA: Productive cough, chest congestion

EXAM:
CHEST - 2 VIEW

[dg chest 2 view (1 of 3)]
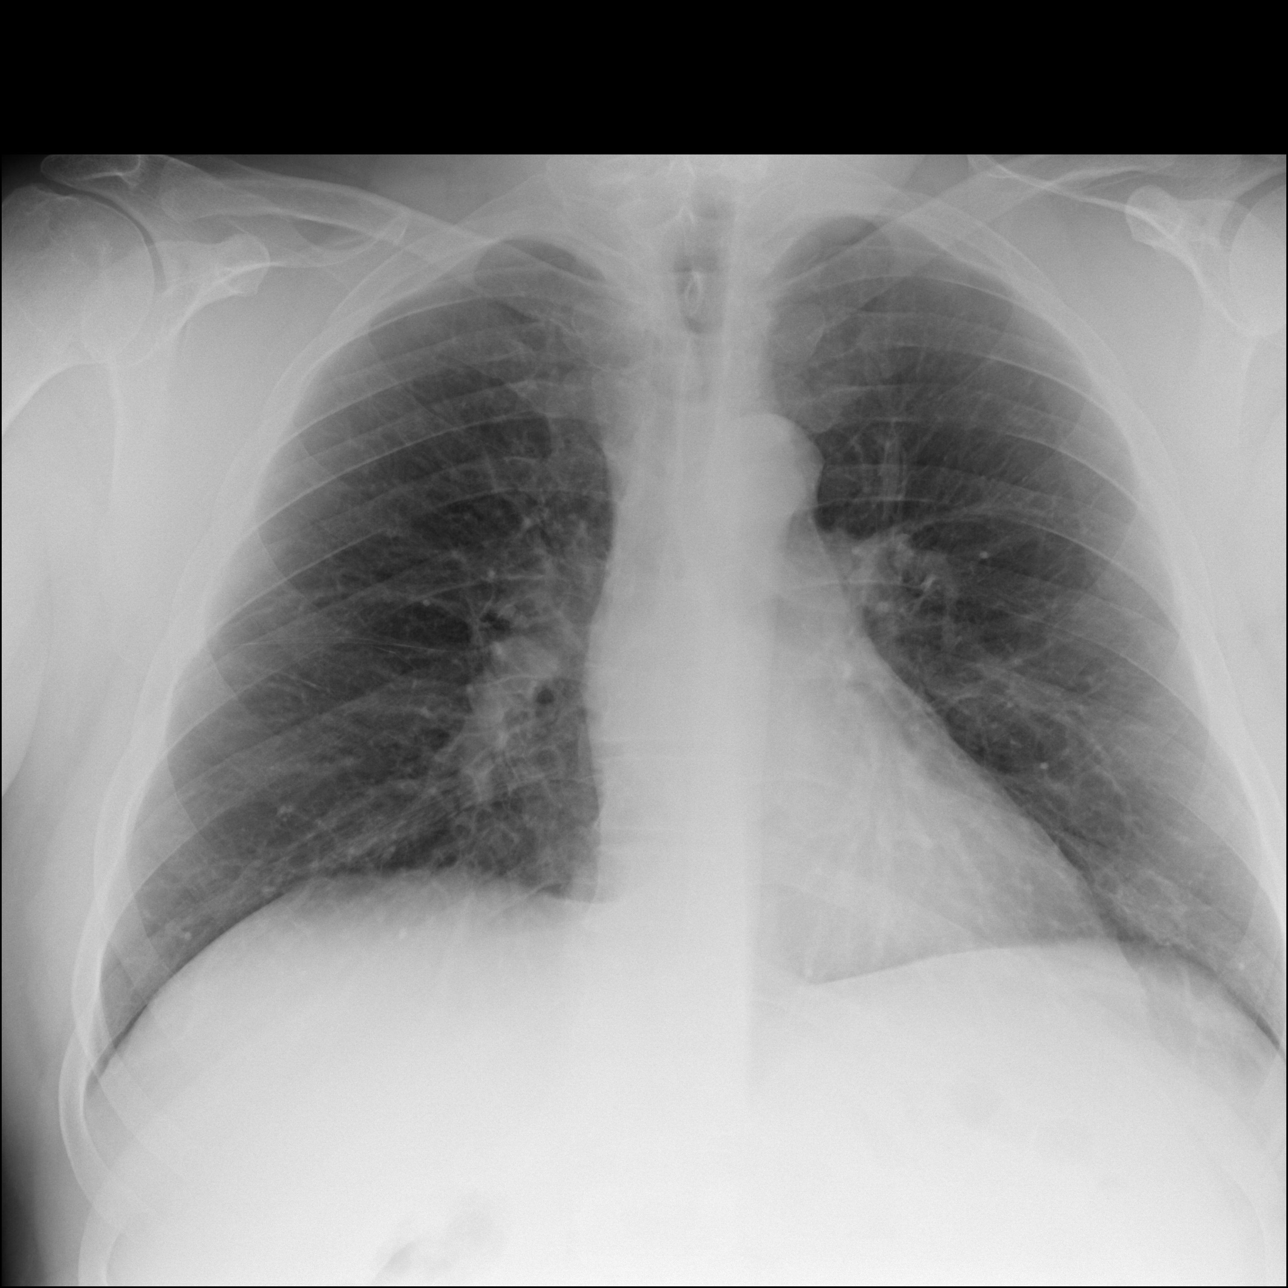

[dg chest 2 view (2 of 3)]
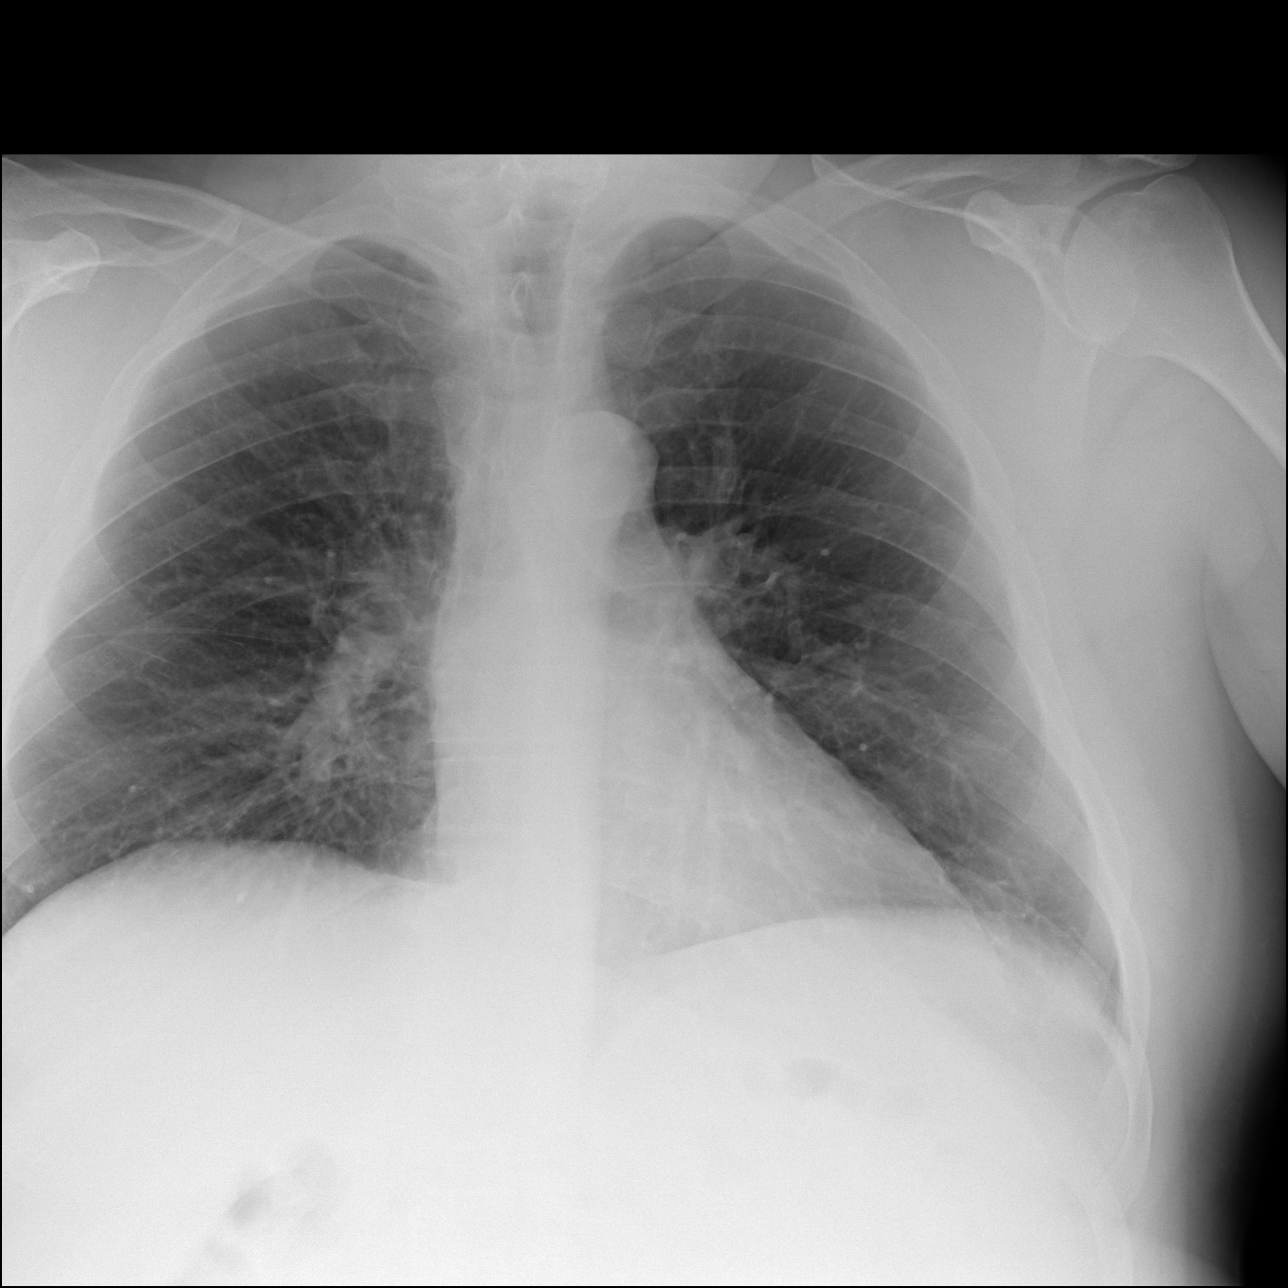

[dg chest 2 view (3 of 3)]
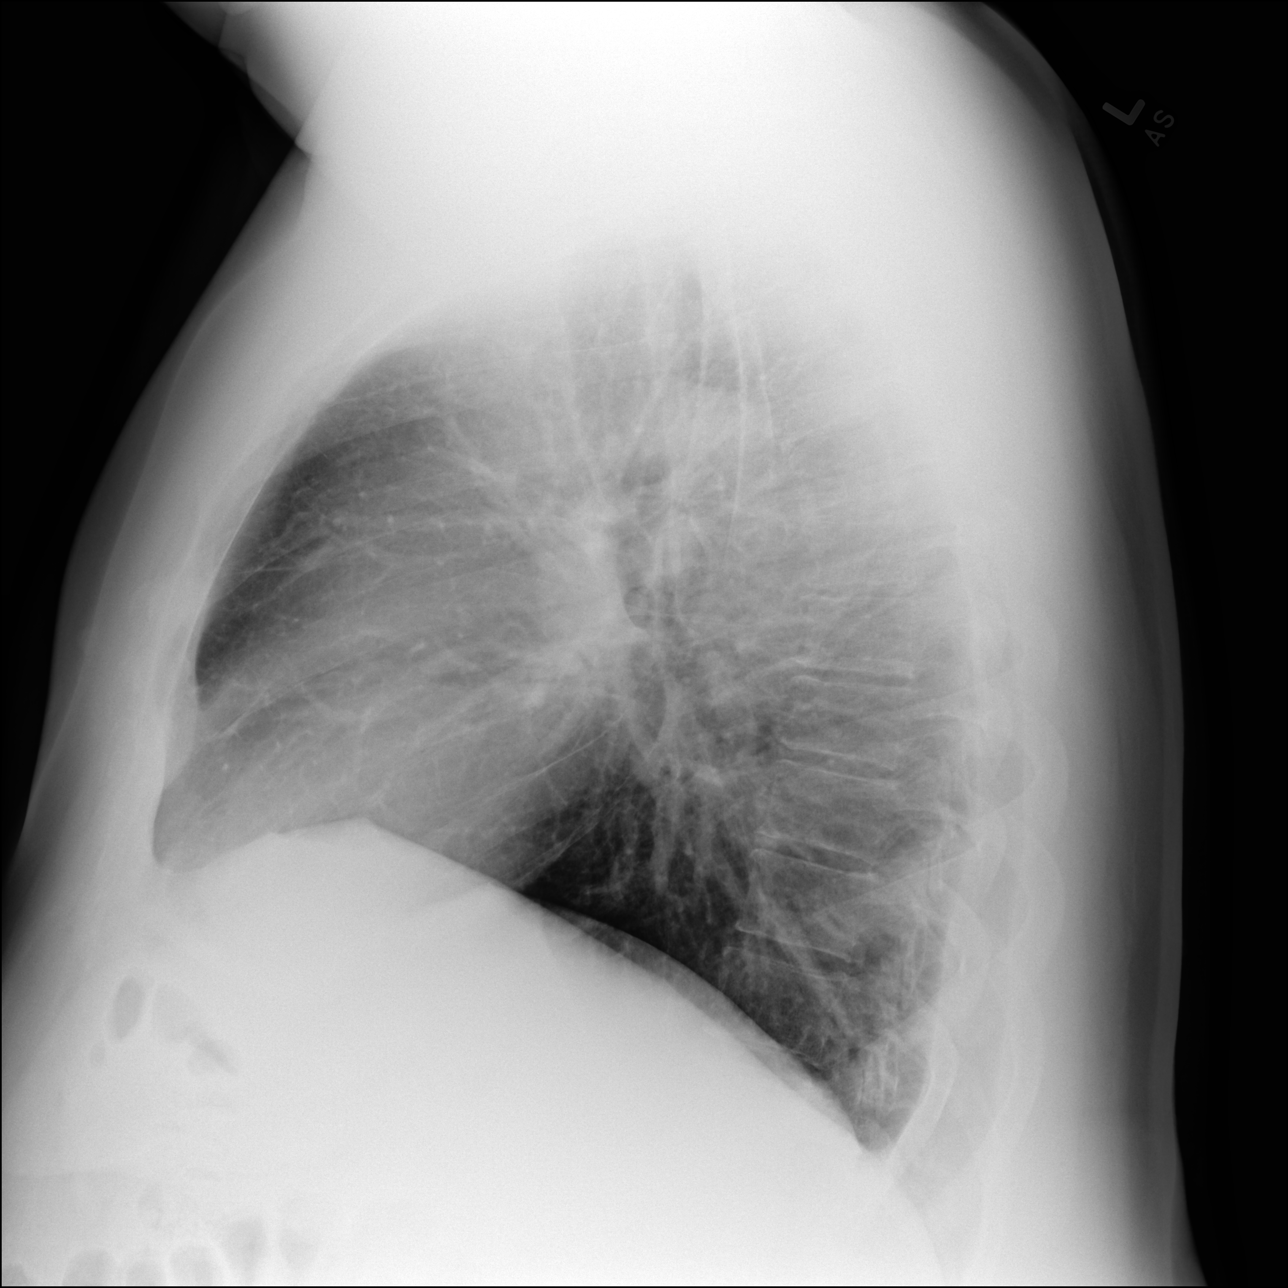

[3 of 3 positions shown; findings below may reference images not displayed]

FINDINGS: Normal heart size, mediastinal contours, and pulmonary vascularity.

Lungs clear.

No pleural effusion or pneumothorax.

Bones unremarkable.
IMPRESSION: No acute abnormalities.

## 2020-10-30 ENCOUNTER — Encounter: Payer: Self-pay | Admitting: Oncology

## 2020-10-30 ENCOUNTER — Inpatient Hospital Stay: Payer: BC Managed Care – PPO | Attending: Oncology | Admitting: Oncology

## 2020-10-30 ENCOUNTER — Encounter (INDEPENDENT_AMBULATORY_CARE_PROVIDER_SITE_OTHER): Payer: Self-pay

## 2020-10-30 ENCOUNTER — Other Ambulatory Visit: Payer: Self-pay

## 2020-10-30 ENCOUNTER — Inpatient Hospital Stay: Payer: BC Managed Care – PPO

## 2020-10-30 DIAGNOSIS — D751 Secondary polycythemia: Secondary | ICD-10-CM

## 2020-10-30 LAB — CBC
HCT: 52.6 % — ABNORMAL HIGH (ref 39.0–52.0)
Hemoglobin: 18.4 g/dL — ABNORMAL HIGH (ref 13.0–17.0)
MCH: 30.4 pg (ref 26.0–34.0)
MCHC: 35 g/dL (ref 30.0–36.0)
MCV: 86.8 fL (ref 80.0–100.0)
Platelets: 188 10*3/uL (ref 150–400)
RBC: 6.06 MIL/uL — ABNORMAL HIGH (ref 4.22–5.81)
RDW: 13.3 % (ref 11.5–15.5)
WBC: 7.1 10*3/uL (ref 4.0–10.5)
nRBC: 0 % (ref 0.0–0.2)

## 2020-10-30 LAB — IRON AND TIBC
Iron: 94 ug/dL (ref 45–182)
Saturation Ratios: 26 % (ref 17.9–39.5)
TIBC: 368 ug/dL (ref 250–450)
UIBC: 274 ug/dL

## 2020-10-30 LAB — FERRITIN: Ferritin: 119 ng/mL (ref 24–336)

## 2020-11-01 NOTE — Progress Notes (Signed)
[facility]  Telephone:([phone] [phone]  ID: Hector Anderson. OB: 03-02-1966  MR#: [id number]  WCB#:[id number]  Patient Care Team: Kaleen Mask, MD as PCP - General (Family Medicine)  CHIEF COMPLAINT: Polycythemia.  INTERVAL HISTORY: Patient is a 54 year old male who was noted to have a persistently elevated hemoglobin despite discontinuing testosterone injections.  He is anxious, but otherwise feels well.  He has occasional headaches, but no other neurologic complaints.  He has a good appetite and denies weight loss.  He denies any chest pain, shortness of breath, cough, or hemoptysis. He has no nausea, vomiting, constipation, or diarrhea. He has no urinary complaints.  Patient offers no specific complaints today.  REVIEW OF SYSTEMS:   Review of Systems  Constitutional: Negative.  Negative for fever, malaise/fatigue and weight loss.  Respiratory: Negative.  Negative for cough, hemoptysis and shortness of breath.   Cardiovascular: Negative.  Negative for chest pain and leg swelling.  Gastrointestinal: Negative.  Negative for abdominal pain.  Genitourinary: Negative.  Negative for dysuria.  Musculoskeletal: Negative.  Negative for back pain.  Skin: Negative.  Negative for rash.  Neurological: Negative.  Negative for dizziness, focal weakness, weakness and headaches.  Psychiatric/Behavioral: The patient is nervous/anxious.     As per HPI. Otherwise, a complete review of systems is negative.  PAST MEDICAL HISTORY: History reviewed. No pertinent past medical history.  PAST SURGICAL HISTORY: Past Surgical History:  Procedure Laterality Date  . COLONOSCOPY WITH PROPOFOL N/A 05/17/2016   Procedure: COLONOSCOPY WITH PROPOFOL;  Surgeon: Christena Deem, MD;  Location: [facility] ENDOSCOPY;  Service: Endoscopy;  Laterality: N/A;  . ESOPHAGOGASTRODUODENOSCOPY (EGD) WITH PROPOFOL N/A 05/29/2018   Procedure: ESOPHAGOGASTRODUODENOSCOPY (EGD) WITH  PROPOFOL;  Surgeon: Toledo, Boykin Nearing, MD;  Location: [facility] ENDOSCOPY;  Service: Gastroenterology;  Laterality: N/A;  . TONSILLECTOMY      FAMILY HISTORY: Family History  Problem Relation Age of Onset  . Diabetes Father     ADVANCED DIRECTIVES (Y/N):  N  HEALTH MAINTENANCE: Social History   Tobacco Use  . Smoking status: Never Smoker  . Smokeless tobacco: Never Used  Vaping Use  . Vaping Use: Never used  Substance Use Topics  . Alcohol use: Not Currently    Alcohol/week: 0.0 standard drinks  . Drug use: Never     Colonoscopy:  Anderson:  Bone density:  Lipid panel:  Allergies  Allergen Reactions  . Meloxicam Hives    Current Outpatient Medications  Medication Sig Dispense Refill  . omeprazole (PRILOSEC) 20 MG capsule Take by mouth.     . Red Yeast Rice Extract 600 MG CAPS Take by mouth.    . gabapentin (NEURONTIN) 100 MG capsule Take 1 capsule (100 mg total) by mouth 2 (two) times daily. (Patient not taking: Reported on 10/30/2020) 60 capsule 1  . Multiple Vitamin (MULTIVITAMIN) tablet Take 1 tablet by mouth daily. (Patient not taking: Reported on 10/30/2020)     No current facility-administered medications for this visit.    OBJECTIVE: Vitals:   10/30/20 1325  BP: (!) 150/96  Pulse: 84  Resp: 20  Temp: 97.8 F (36.6 C)  SpO2: 98%     Body mass index is 40.86 kg/m.    ECOG FS:0 - Asymptomatic  General: Well-developed, well-nourished, no acute distress. Eyes: Pink conjunctiva, anicteric sclera. HEENT: Normocephalic, moist mucous membranes. Lungs: No audible wheezing or coughing. Heart: Regular rate and rhythm. Abdomen: Soft, nontender, no obvious distention. Musculoskeletal: No edema, cyanosis, or clubbing. Neuro: Alert,  answering all questions appropriately. Cranial nerves grossly intact. Skin: No rashes or petechiae noted. Psych: Normal affect. Lymphatics: No cervical, calvicular, axillary or inguinal LAD.   LAB RESULTS:  No results found for: NA,  K, CL, CO2, GLUCOSE, BUN, CREATININE, CALCIUM, PROT, ALBUMIN, AST, ALT, ALKPHOS, BILITOT, GFRNONAA, GFRAA  Lab Results  Component Value Date   WBC 7.1 10/30/2020   HGB 18.4 (H) 10/30/2020   HCT 52.6 (H) 10/30/2020   MCV 86.8 10/30/2020   PLT 188 10/30/2020   Lab Results  Component Value Date   IRON 94 10/30/2020   TIBC 368 10/30/2020   IRONPCTSAT 26 10/30/2020   Lab Results  Component Value Date   FERRITIN 119 10/30/2020     STUDIES: No results found.  ASSESSMENT: Polycythemia.  PLAN:    1. Polycythemia:  Patient's hemoglobin remains elevated at 18.4 despite discontinuing testosterone injections several weeks ago.  His elevation may still be related, but have done a full work up for completeness. Iron stores are within normal limits.  Erythropoietin, Carbon monoxide levels, JAK-2 mutation, and Hemochromatosis gene mutation are pending at time of dictation. Patient will return to clinic in 3 weeks for discussion of his laboratory work and initiation of phlebotomy.  I spent a total of 45 minutes reviewing chart data, face-to-face evaluation with the patient, counseling and coordination of care as detailed above.  Patient expressed understanding and was in agreement with this plan. He also understands that He can call clinic at any time with any questions, concerns, or complaints.     Jeralyn Ruths, MD   11/01/2020 10:39 AM

## 2020-11-02 LAB — CARBON MONOXIDE, BLOOD (PERFORMED AT REF LAB): Carbon Monoxide, Blood: 2.6 % (ref 0.0–3.6)

## 2020-11-02 LAB — ERYTHROPOIETIN: Erythropoietin: 6.1 m[IU]/mL (ref 2.6–18.5)

## 2020-11-05 LAB — HEMOCHROMATOSIS DNA-PCR(C282Y,H63D)

## 2020-11-06 LAB — JAK2 GENOTYPR

## 2020-11-11 NOTE — Progress Notes (Signed)
[facility]  Telephone:([phone] [phone]  ID: Berniece Pap. OB: 1966-07-21  MR#: [id number]  UDJ#:[id number]  Patient Care Team: Kaleen Mask, MD as PCP - General (Family Medicine)  CHIEF COMPLAINT: Polycythemia.  INTERVAL HISTORY: Patient returns to clinic today for further evaluation, discussion of his laboratory work, and initiation of phlebotomy. He currently feels well and is asymptomatic. He has no neurologic complaints.  He has a good appetite and denies weight loss.  He denies any chest pain, shortness of breath, cough, or hemoptysis. He has no nausea, vomiting, constipation, or diarrhea. He has no urinary complaints. Patient offers no specific complaints today.   REVIEW OF SYSTEMS:   Review of Systems  Constitutional: Negative.  Negative for fever, malaise/fatigue and weight loss.  Respiratory: Negative.  Negative for cough, hemoptysis and shortness of breath.   Cardiovascular: Negative.  Negative for chest pain and leg swelling.  Gastrointestinal: Negative.  Negative for abdominal pain.  Genitourinary: Negative.  Negative for dysuria.  Musculoskeletal: Negative.  Negative for back pain.  Skin: Negative.  Negative for rash.  Neurological: Negative.  Negative for dizziness, focal weakness, weakness and headaches.  Psychiatric/Behavioral: Negative.  The patient is not nervous/anxious.     As per HPI. Otherwise, a complete review of systems is negative.  PAST MEDICAL HISTORY: History reviewed. No pertinent past medical history.  PAST SURGICAL HISTORY: Past Surgical History:  Procedure Laterality Date  . COLONOSCOPY WITH PROPOFOL N/A 05/17/2016   Procedure: COLONOSCOPY WITH PROPOFOL;  Surgeon: Christena Deem, MD;  Location: [facility] ENDOSCOPY;  Service: Endoscopy;  Laterality: N/A;  . ESOPHAGOGASTRODUODENOSCOPY (EGD) WITH PROPOFOL N/A 05/29/2018   Procedure: ESOPHAGOGASTRODUODENOSCOPY (EGD) WITH PROPOFOL;  Surgeon: Toledo, Boykin Nearing, MD;  Location: [facility] ENDOSCOPY;  Service: Gastroenterology;  Laterality: N/A;  . TONSILLECTOMY      FAMILY HISTORY: Family History  Problem Relation Age of Onset  . Diabetes Father     ADVANCED DIRECTIVES (Y/N):  N  HEALTH MAINTENANCE: Social History   Tobacco Use  . Smoking status: Never Smoker  . Smokeless tobacco: Never Used  Vaping Use  . Vaping Use: Never used  Substance Use Topics  . Alcohol use: Not Currently    Alcohol/week: 0.0 standard drinks  . Drug use: Never     Colonoscopy:  PAP:  Bone density:  Lipid panel:  Allergies  Allergen Reactions  . Meloxicam Hives    Current Outpatient Medications  Medication Sig Dispense Refill  . aspirin 81 MG chewable tablet Chew 81 mg by mouth daily.    Marland Kitchen omeprazole (PRILOSEC) 20 MG capsule Take by mouth.     . Red Yeast Rice Extract 600 MG CAPS Take by mouth.    . gabapentin (NEURONTIN) 100 MG capsule Take 1 capsule (100 mg total) by mouth 2 (two) times daily. (Patient not taking: Reported on 10/30/2020) 60 capsule 1  . Multiple Vitamin (MULTIVITAMIN) tablet Take 1 tablet by mouth daily. (Patient not taking: Reported on 10/30/2020)     No current facility-administered medications for this visit.    OBJECTIVE: Vitals:   11/19/20 1419  BP: (!) 149/93  Pulse: 73  Resp: 20  Temp: 98 F (36.7 C)  SpO2: 96%     Body mass index is 40.61 kg/m.    ECOG FS:0 - Asymptomatic  General: Well-developed, well-nourished, no acute distress. Eyes: Pink conjunctiva, anicteric sclera. HEENT: Normocephalic, moist mucous membranes. Lungs: No audible wheezing or coughing. Heart: Regular rate and rhythm. Abdomen: Soft, nontender, no obvious  distention. Musculoskeletal: No edema, cyanosis, or clubbing. Neuro: Alert, answering all questions appropriately. Cranial nerves grossly intact. Skin: No rashes or petechiae noted. Psych: Normal affect.   LAB RESULTS:  No results found for: NA, K, CL, CO2, GLUCOSE, BUN, CREATININE,  CALCIUM, PROT, ALBUMIN, AST, ALT, ALKPHOS, BILITOT, GFRNONAA, GFRAA  Lab Results  Component Value Date   WBC 7.1 10/30/2020   HGB 18.4 (H) 10/30/2020   HCT 52.6 (H) 10/30/2020   MCV 86.8 10/30/2020   PLT 188 10/30/2020   Lab Results  Component Value Date   IRON 94 10/30/2020   TIBC 368 10/30/2020   IRONPCTSAT 26 10/30/2020   Lab Results  Component Value Date   FERRITIN 119 10/30/2020     STUDIES: No results found.  ASSESSMENT: Polycythemia.  PLAN:    1. Polycythemia: Patient discontinued testosterone injections in September 2021. Despite this, his hemoglobin remains elevated 18.4 all of his other laboratory work including iron stores, erythropoietin, carbon monoxide levels, JAK-2 mutation, and Hemochromatosis gene mutation are all negative or within normal limits. Given his hemoglobin greater than eighteen, will proceed with 500 mL phlebotomy today. Return to clinic in 4 months with repeat laboratory work further evaluation, and consideration of phlebotomy if his hemoglobin remains greater than 18.0.  I spent a total of 20 minutes reviewing chart data, face-to-face evaluation with the patient, counseling and coordination of care as detailed above.   Patient expressed understanding and was in agreement with this plan. He also understands that He can call clinic at any time with any questions, concerns, or complaints.     Jeralyn Ruths, MD   11/21/2020 7:18 AM

## 2020-11-19 ENCOUNTER — Other Ambulatory Visit: Payer: Self-pay | Admitting: Oncology

## 2020-11-19 ENCOUNTER — Inpatient Hospital Stay (HOSPITAL_BASED_OUTPATIENT_CLINIC_OR_DEPARTMENT_OTHER): Payer: BC Managed Care – PPO | Admitting: Oncology

## 2020-11-19 ENCOUNTER — Inpatient Hospital Stay: Payer: BC Managed Care – PPO | Attending: Oncology

## 2020-11-19 ENCOUNTER — Encounter: Payer: Self-pay | Admitting: Oncology

## 2020-11-19 ENCOUNTER — Other Ambulatory Visit: Payer: Self-pay

## 2020-11-19 VITALS — BP 154/100

## 2020-11-19 VITALS — BP 149/93 | HR 73 | Temp 98.0°F | Resp 20 | Wt 275.0 lb

## 2020-11-19 DIAGNOSIS — D751 Secondary polycythemia: Secondary | ICD-10-CM | POA: Diagnosis not present

## 2020-11-19 NOTE — Progress Notes (Signed)
Patient tolerated phlebotomy procedure without any complications.

## 2020-11-19 NOTE — Progress Notes (Signed)
BP remained elevated after phlebotomy, Dr. Orlie Dakin notified and advised patient to follow up with his PCP for BP. Patient verbalized understanding and discharged home.

## 2020-11-20 ENCOUNTER — Ambulatory Visit: Payer: BC Managed Care – PPO | Admitting: Oncology

## 2021-03-12 ENCOUNTER — Other Ambulatory Visit: Payer: Self-pay

## 2021-03-12 DIAGNOSIS — D751 Secondary polycythemia: Secondary | ICD-10-CM

## 2021-03-15 ENCOUNTER — Other Ambulatory Visit: Payer: Self-pay

## 2021-03-15 ENCOUNTER — Encounter: Payer: Self-pay | Admitting: Oncology

## 2021-03-15 ENCOUNTER — Inpatient Hospital Stay (HOSPITAL_BASED_OUTPATIENT_CLINIC_OR_DEPARTMENT_OTHER): Payer: BC Managed Care – PPO | Admitting: Oncology

## 2021-03-15 ENCOUNTER — Inpatient Hospital Stay: Payer: BC Managed Care – PPO

## 2021-03-15 ENCOUNTER — Inpatient Hospital Stay: Payer: BC Managed Care – PPO | Attending: Oncology

## 2021-03-15 VITALS — BP 156/104 | HR 91 | Temp 99.0°F | Resp 20 | Wt 264.0 lb

## 2021-03-15 DIAGNOSIS — D751 Secondary polycythemia: Secondary | ICD-10-CM | POA: Insufficient documentation

## 2021-03-15 DIAGNOSIS — Z79899 Other long term (current) drug therapy: Secondary | ICD-10-CM | POA: Diagnosis not present

## 2021-03-15 DIAGNOSIS — Z833 Family history of diabetes mellitus: Secondary | ICD-10-CM | POA: Insufficient documentation

## 2021-03-15 LAB — CBC WITH DIFFERENTIAL/PLATELET
Abs Immature Granulocytes: 0.02 10*3/uL (ref 0.00–0.07)
Basophils Absolute: 0.1 10*3/uL (ref 0.0–0.1)
Basophils Relative: 1 %
Eosinophils Absolute: 0.3 10*3/uL (ref 0.0–0.5)
Eosinophils Relative: 4 %
HCT: 47.4 % (ref 39.0–52.0)
Hemoglobin: 16.2 g/dL (ref 13.0–17.0)
Immature Granulocytes: 0 %
Lymphocytes Relative: 26 %
Lymphs Abs: 1.8 10*3/uL (ref 0.7–4.0)
MCH: 32.1 pg (ref 26.0–34.0)
MCHC: 34.2 g/dL (ref 30.0–36.0)
MCV: 94 fL (ref 80.0–100.0)
Monocytes Absolute: 0.6 10*3/uL (ref 0.1–1.0)
Monocytes Relative: 9 %
Neutro Abs: 4 10*3/uL (ref 1.7–7.7)
Neutrophils Relative %: 60 %
Platelets: 158 10*3/uL (ref 150–400)
RBC: 5.04 MIL/uL (ref 4.22–5.81)
RDW: 12.2 % (ref 11.5–15.5)
WBC: 6.7 10*3/uL (ref 4.0–10.5)
nRBC: 0 % (ref 0.0–0.2)

## 2021-03-15 LAB — IRON AND TIBC
Iron: 49 ug/dL (ref 45–182)
Saturation Ratios: 13 % — ABNORMAL LOW (ref 17.9–39.5)
TIBC: 368 ug/dL (ref 250–450)
UIBC: 319 ug/dL

## 2021-03-15 LAB — FERRITIN: Ferritin: 94 ng/mL (ref 24–336)

## 2021-03-15 NOTE — Progress Notes (Signed)
Patient denies any concerns today.  

## 2021-03-16 LAB — ERYTHROPOIETIN: Erythropoietin: 8.2 m[IU]/mL (ref 2.6–18.5)

## 2021-03-17 NOTE — Progress Notes (Signed)
[facility]  [phone] [phone]  ID: Berniece Pap. OB: [birth year]  MR#: [id number]  IOE#:[id number]  Patient Care Team: Kaleen Mask, MD as PCP - General (Family Medicine)  CHIEF COMPLAINT: Polycythemia.  INTERVAL HISTORY: Patient returns to clinic today for further evaluation, discussion of his laboratory work and possible phlebotomy.  He was last seen in clinic on 11/19/2020.  Had a 500 mL phlebotomy on 11/19/20.   Today, he continues to feel well and is asymptomatic.  He denies any hyperviscosity symptoms.  Denies headache specifically.  Appetite is stable denies weight loss.  He denies chest pain, shortness of breath, cough or hemoptysis.  No nausea, vomiting, constipation or diarrhea.  He is no longer on testosterone supplements.  His last injection was in September 2021.   REVIEW OF SYSTEMS:   Review of Systems  Constitutional: Negative.  Negative for fever, malaise/fatigue and weight loss.  Respiratory: Negative.  Negative for cough, hemoptysis and shortness of breath.   Cardiovascular: Negative.  Negative for chest pain and leg swelling.  Gastrointestinal: Negative.  Negative for abdominal pain.  Genitourinary: Negative.  Negative for dysuria.  Musculoskeletal: Negative.  Negative for back pain.  Skin: Negative.  Negative for rash.  Neurological: Negative.  Negative for dizziness, focal weakness, weakness and headaches.  Psychiatric/Behavioral: Negative.  The patient is not nervous/anxious.     As per HPI. Otherwise, a complete review of systems is negative.  PAST MEDICAL HISTORY: History reviewed. No pertinent past medical history.  PAST SURGICAL HISTORY: Past Surgical History:  Procedure Laterality Date  . COLONOSCOPY WITH PROPOFOL N/A 05/17/2016   Procedure: COLONOSCOPY WITH PROPOFOL;  Surgeon: Christena Deem, MD;  Location: [facility] ENDOSCOPY;  Service: Endoscopy;  Laterality: N/A;  . ESOPHAGOGASTRODUODENOSCOPY  (EGD) WITH PROPOFOL N/A 05/29/2018   Procedure: ESOPHAGOGASTRODUODENOSCOPY (EGD) WITH PROPOFOL;  Surgeon: Toledo, Boykin Nearing, MD;  Location: [facility] ENDOSCOPY;  Service: Gastroenterology;  Laterality: N/A;  . TONSILLECTOMY      FAMILY HISTORY: Family History  Problem Relation Age of Onset  . Diabetes Father     ADVANCED DIRECTIVES (Y/N):  N  HEALTH MAINTENANCE: Social History   Tobacco Use  . Smoking status: Never Smoker  . Smokeless tobacco: Never Used  Vaping Use  . Vaping Use: Never used  Substance Use Topics  . Alcohol use: Not Currently    Alcohol/week: 0.0 standard drinks  . Drug use: Never     Colonoscopy:  PAP:  Bone density:  Lipid panel:  No Active Allergies  Current Outpatient Medications  Medication Sig Dispense Refill  . omeprazole (PRILOSEC) 20 MG capsule Take by mouth.     Marland Kitchen aspirin 81 MG chewable tablet Chew 81 mg by mouth daily. (Patient not taking: Reported on 03/15/2021)    . gabapentin (NEURONTIN) 100 MG capsule Take 1 capsule (100 mg total) by mouth 2 (two) times daily. (Patient not taking: No sig reported) 60 capsule 1  . Multiple Vitamin (MULTIVITAMIN) tablet Take 1 tablet by mouth daily. (Patient not taking: No sig reported)    . Red Yeast Rice Extract 600 MG CAPS Take by mouth. (Patient not taking: Reported on 03/15/2021)     No current facility-administered medications for this visit.    OBJECTIVE: Vitals:   03/15/21 1424  BP: (!) 156/104  Pulse: 91  Resp: 20  Temp: 99 F (37.2 C)     Body mass index is 38.99 kg/m.    ECOG FS:0 - Asymptomatic  Physical Exam Constitutional:  Appearance: Normal appearance.  HENT:     Head: Normocephalic and atraumatic.  Eyes:     Pupils: Pupils are equal, round, and reactive to light.  Cardiovascular:     Rate and Rhythm: Normal rate and regular rhythm.     Heart sounds: Normal heart sounds. No murmur heard.   Pulmonary:     Effort: Pulmonary effort is normal.     Breath sounds: Normal breath  sounds. No wheezing.  Abdominal:     General: Bowel sounds are normal. There is no distension.     Palpations: Abdomen is soft.     Tenderness: There is no abdominal tenderness.  Musculoskeletal:        General: Normal range of motion.     Cervical back: Normal range of motion.  Skin:    General: Skin is warm and dry.     Findings: No rash.  Neurological:     Mental Status: He is alert and oriented to person, place, and time.  Psychiatric:        Judgment: Judgment normal.       LAB RESULTS:  No results found for: NA, K, CL, CO2, GLUCOSE, BUN, CREATININE, CALCIUM, PROT, ALBUMIN, AST, ALT, ALKPHOS, BILITOT, GFRNONAA, GFRAA  Lab Results  Component Value Date   WBC 6.7 03/15/2021   NEUTROABS 4.0 03/15/2021   HGB 16.2 03/15/2021   HCT 47.4 03/15/2021   MCV 94.0 03/15/2021   PLT 158 03/15/2021   Lab Results  Component Value Date   IRON 49 03/15/2021   TIBC 368 03/15/2021   IRONPCTSAT 13 (L) 03/15/2021   Lab Results  Component Value Date   FERRITIN 94 03/15/2021     STUDIES: No results found.  ASSESSMENT: Polycythemia.  PLAN:    1. Polycythemia:  -Secondary to testosterone injection.  Last given in September 2021. -Work-up included iron levels, erythropoietin, Carbon monoxide levels, JAK2 mutation and hemochromatosis gene mutation which were all negative. -Labs from 10/30/2020 showed a hemoglobin of 18.4 and hematocrit of 52.6. -He received 500 mL phlebotomy on 11/19/20. -Goal is to keep hemoglobin less than 18. -Labs from 03/15/2021 show hemoglobin of 16.2 and hematocrit of 47.4.  Ferritin is 94 and iron saturations are 13%. -He is not symptomatic. -No additional phlebotomy needed.  Disposition: -RTC in 3 months with labs and virtual visit.  Greater than 50% was spent in counseling and coordination of care with this patient including but not limited to discussion of the relevant topics above (See A&P) including, but not limited to diagnosis and management of  acute and chronic medical conditions.   Patient expressed understanding and was in agreement with this plan. He also understands that He can call clinic at any time with any questions, concerns, or complaints.    Mauro Kaufmann, NP   03/17/2021 2:33 PM

## 2021-03-21 LAB — JAK2 GENOTYPR

## 2021-06-11 ENCOUNTER — Telehealth: Payer: Self-pay | Admitting: Oncology

## 2021-06-11 NOTE — Telephone Encounter (Signed)
Patient called and requested to cancel his upcoming appts. He stated that he is "feeling fine" and that coming to the cancer center is "too expensive".

## 2021-06-15 ENCOUNTER — Inpatient Hospital Stay: Payer: BC Managed Care – PPO

## 2021-06-16 ENCOUNTER — Inpatient Hospital Stay: Payer: BC Managed Care – PPO | Admitting: Oncology

## 2022-01-18 ENCOUNTER — Other Ambulatory Visit: Payer: Self-pay

## 2022-01-18 ENCOUNTER — Ambulatory Visit (INDEPENDENT_AMBULATORY_CARE_PROVIDER_SITE_OTHER): Payer: BC Managed Care – PPO | Admitting: Urology

## 2022-01-18 ENCOUNTER — Encounter: Payer: Self-pay | Admitting: Urology

## 2022-01-18 VITALS — BP 139/88 | HR 71 | Ht 69.0 in | Wt 259.0 lb

## 2022-01-18 DIAGNOSIS — N529 Male erectile dysfunction, unspecified: Secondary | ICD-10-CM

## 2022-01-18 DIAGNOSIS — N5089 Other specified disorders of the male genital organs: Secondary | ICD-10-CM

## 2022-01-18 MED ORDER — TADALAFIL 5 MG PO TABS: 5.0000 mg | ORAL_TABLET | ORAL | 11 refills | Status: DC

## 2022-01-18 NOTE — Patient Instructions (Signed)
Erectile Dysfunction °Erectile dysfunction (ED) is the inability to get or keep an erection in order to have sexual intercourse. ED is considered a symptom of an underlying disorder and is not considered a disease. ED may include: °Inability to get an erection. °Lack of enough hardness of the erection to allow penetration. °Loss of erection before sex is finished. °What are the causes? °This condition may be caused by: °Physical causes, such as: °Artery problems. This may include heart disease, high blood pressure, atherosclerosis, and diabetes. °Hormonal problems, such as low testosterone. °Obesity. °Nerve problems. This may include back or pelvic injuries, multiple sclerosis, Parkinson's disease, spinal cord injury, and stroke. °Certain medicines, such as: °Pain relievers. °Antidepressants. °Blood pressure medicines and water pills (diuretics). °Cancer medicines. °Antihistamines. °Muscle relaxants. °Lifestyle factors, such as: °Use of drugs such as marijuana, cocaine, or opioids. °Excessive use of alcohol. °Smoking. °Lack of physical activity or exercise. °Psychological causes, such as: °Anxiety or stress. °Sadness or depression. °Exhaustion. °Fear about sexual performance. °Guilt. °What are the signs or symptoms? °Symptoms of this condition include: °Inability to get an erection. °Lack of enough hardness of the erection to allow penetration. °Loss of the erection before sex is finished. °Sometimes having normal erections, but with frequent unsatisfactory episodes. °Low sexual satisfaction in either partner due to erection problems. °A curved penis occurring with erection. The curve may cause pain, or the penis may be too curved to allow for intercourse. °Never having nighttime or morning erections. °How is this diagnosed? °This condition is often diagnosed by: °Performing a physical exam to find other diseases or specific problems with the penis. °Asking you detailed questions about the problem. °Doing tests,  such as: °Blood tests to check for diabetes mellitus or high cholesterol, or to measure hormone levels. °Other tests to check for underlying health conditions. °An ultrasound exam to check for scarring. °A test to check blood flow to the penis. °Doing a sleep study at home to measure nighttime erections. °How is this treated? °This condition may be treated by: °Medicines, such as: °Medicine taken by mouth to help you achieve an erection (oral medicine). °Hormone replacement therapy to replace low testosterone levels. °Medicine that is injected into the penis. Your health care provider may instruct you how to give yourself these injections at home. °Medicine that is delivered with a short applicator tube. The tube is inserted into the opening at the tip of the penis, which is the opening of the urethra. A tiny pellet of medicine is put in the urethra. The pellet dissolves and enhances erectile function. This is also called MUSE (medicated urethral system for erections) therapy. °Vacuum pump. This is a pump with a ring on it. The pump and ring are placed on the penis and used to create pressure that helps the penis become erect. °Penile implant surgery. In this procedure, you may receive: °An inflatable implant. This consists of cylinders, a pump, and a reservoir. The cylinders can be inflated with a fluid that helps to create an erection, and they can be deflated after intercourse. °A semi-rigid implant. This consists of two silicone rubber rods. The rods provide some rigidity. They are also flexible, so the penis can both curve downward in its normal position and become straight for sexual intercourse. °Blood vessel surgery to improve blood flow to the penis. During this procedure, a blood vessel from a different part of the body is placed into the penis to allow blood to flow around (bypass) damaged or blocked blood vessels. °Lifestyle changes,   such as exercising more, losing weight, and quitting smoking. °Follow  these instructions at home: °Medicines ° °Take over-the-counter and prescription medicines only as told by your health care provider. Do not increase the dosage without first discussing it with your health care provider. °If you are using self-injections, do injections as directed by your health care provider. Make sure you avoid any veins that are on the surface of the penis. After giving an injection, apply pressure to the injection site for 5 minutes. °Talk to your health care provider about how to prevent headaches while taking ED medicines. These medicines may cause a sudden headache due to the increase in blood flow in your body. °General instructions °Exercise regularly, as directed by your health care provider. Work with your health care provider to lose weight, if needed. °Do not use any products that contain nicotine or tobacco. These products include cigarettes, chewing tobacco, and vaping devices, such as e-cigarettes. If you need help quitting, ask your health care provider. °Before using a vacuum pump, read the instructions that come with the pump and discuss any questions with your health care provider. °Keep all follow-up visits. This is important. °Contact a health care provider if: °You feel nauseous. °You are vomiting. °You get sudden headaches while taking ED medicines. °You have any concerns about your sexual health. °Get help right away if: °You are taking oral or injectable medicines and you have an erection that lasts longer than 4 hours. If your health care provider is unavailable, go to the nearest emergency room for evaluation. An erection that lasts much longer than 4 hours can result in permanent damage to your penis. °You have severe pain in your groin or abdomen. °You develop redness or severe swelling of your penis. °You have redness spreading at your groin or lower abdomen. °You are unable to urinate. °You experience chest pain or a rapid heartbeat (palpitations) after taking oral  medicines. °These symptoms may represent a serious problem that is an emergency. Do not wait to see if the symptoms will go away. Get medical help right away. Call your local emergency services (911 in the [place] Do not drive yourself to the hospital. °Summary °Erectile dysfunction (ED) is the inability to get or keep an erection during sexual intercourse. °This condition is diagnosed based on a physical exam, your symptoms, and tests to determine the cause. Treatment varies depending on the cause and may include medicines, hormone therapy, surgery, or a vacuum pump. °You may need follow-up visits to make sure that you are using your medicines or devices correctly. °Get help right away if you are taking or injecting medicines and you have an erection that lasts longer than 4 hours. °This information is not intended to replace advice given to you by your health care provider. Make sure you discuss any questions you have with your health care provider. °Document Revised: 03/03/2021 Document Reviewed: 03/03/2021 °Elsevier Patient Education © 2022 Elsevier Inc. ° °

## 2022-01-18 NOTE — Progress Notes (Signed)
° °  01/18/22 4:14 PM   Daisy Floro Jr. 11-07-1966 [id number]  CC: Scrotal swelling, ED, low testosterone  HPI: I saw Mr. Tooke today for the above issues.  He is a 56 year old male who recently started ED treatment with wavetech in [facility] for ED.  Those notes are not available to me.  He reports some scrotal swelling at the base of the penis since those treatments.  This is not particularly bothersome, but he wants to make sure nothing else is going on.  He has not tried any PDE 5 inhibitors.  He reportedly has a history of low testosterone and was on testosterone injections through his PCP, but this was discontinued secondary to elevated hematocrit.    Surgical History: Past Surgical History:  Procedure Laterality Date   COLONOSCOPY WITH PROPOFOL N/A 05/17/2016   Procedure: COLONOSCOPY WITH PROPOFOL;  Surgeon: Lollie Sails, MD;  Location: [facility] ENDOSCOPY;  Service: Endoscopy;  Laterality: N/A;   ESOPHAGOGASTRODUODENOSCOPY (EGD) WITH PROPOFOL N/A 05/29/2018   Procedure: ESOPHAGOGASTRODUODENOSCOPY (EGD) WITH PROPOFOL;  Surgeon: Toledo, Benay Pike, MD;  Location: [facility] ENDOSCOPY;  Service: Gastroenterology;  Laterality: N/A;   TONSILLECTOMY       Family History: Family History  Problem Relation Age of Onset   Diabetes Father     Social History:  reports that he has never smoked. He has never used smokeless tobacco. He reports that he does not currently use alcohol. He reports that he does not use drugs.  Physical Exam: BP 139/88    Pulse 71    Ht 5\' 9"  (1.753 m)    Wt 259 lb (117.5 kg)    BMI 38.25 kg/m    Constitutional:  Alert and oriented, No acute distress. Cardiovascular: No clubbing, cyanosis, or edema. Respiratory: Normal respiratory effort, no increased work of breathing. GI: Abdomen is soft, nontender, nondistended, no abdominal masses GU: Phallus with patent meatus, no lesions, testicles 20 cc and descended bilaterally without masses, mild bilateral scrotal  swelling  Assessment & Plan:   56 year old male with mild scrotal swelling after undergoing wavetech treatments for ED in [place].  Exam is reassuring.  I recommended a trial of Cialis 5 mg every other day to see if this helps with some of his erection problems, and we discussed that shockwave for ED is not currently recommended by the AUA guidelines secondary to lack of data.  I also recommended considering a scrotal ultrasound if he has worsening swelling in the future, but based on the timing suspect this is related to his ED treatments.  Trial of Cialis for ED He prefers as needed follow-up, but return precautions discussed extensively   Nickolas Madrid, MD 01/18/2022  [facility] [address], [place], [place] [address] [phone]

## 2023-01-30 ENCOUNTER — Other Ambulatory Visit: Payer: Self-pay | Admitting: Urology

## 2023-02-08 ENCOUNTER — Other Ambulatory Visit
Admission: RE | Admit: 2023-02-08 | Discharge: 2023-02-08 | Disposition: A | Discharge status: Home / Self Care | Payer: BC Managed Care – PPO | Attending: Urology | Admitting: Urology

## 2023-02-08 ENCOUNTER — Encounter: Payer: Self-pay | Admitting: Urology

## 2023-02-08 ENCOUNTER — Ambulatory Visit (INDEPENDENT_AMBULATORY_CARE_PROVIDER_SITE_OTHER): Payer: BC Managed Care – PPO | Admitting: Urology

## 2023-02-08 VITALS — BP 138/84 | HR 57 | Ht 69.0 in | Wt 235.0 lb

## 2023-02-08 DIAGNOSIS — N529 Male erectile dysfunction, unspecified: Secondary | ICD-10-CM

## 2023-02-08 DIAGNOSIS — E291 Testicular hypofunction: Secondary | ICD-10-CM

## 2023-02-08 MED ORDER — TADALAFIL 10 MG PO TABS: 10.0000 mg | ORAL_TABLET | Freq: Every day | ORAL | 3 refills | Status: DC

## 2023-02-08 NOTE — Patient Instructions (Signed)
Erectile Dysfunction Erectile dysfunction (ED) is the inability to get or keep an erection in order to have sexual intercourse. ED is considered a symptom of an underlying disorder and is not considered a disease. ED may include: Inability to get an erection. Lack of enough hardness of the erection to allow penetration. Loss of erection before sex is finished. What are the causes? This condition may be caused by: Physical causes, such as: Artery problems. This may include heart disease, high blood pressure, atherosclerosis, and diabetes. Hormonal problems, such as low testosterone. Obesity. Nerve problems. This may include back or pelvic injuries, multiple sclerosis, Parkinson's disease, spinal cord injury, and stroke. Certain medicines, such as: Pain relievers. Antidepressants. Blood pressure medicines and water pills (diuretics). Cancer medicines. Antihistamines. Muscle relaxants. Lifestyle factors, such as: Use of drugs such as marijuana, cocaine, or opioids. Excessive use of alcohol. Smoking. Lack of physical activity or exercise. Psychological causes, such as: Anxiety or stress. Sadness or depression. Exhaustion. Fear about sexual performance. Guilt. What are the signs or symptoms? Symptoms of this condition include: Inability to get an erection. Lack of enough hardness of the erection to allow penetration. Loss of the erection before sex is finished. Sometimes having normal erections, but with frequent unsatisfactory episodes. Low sexual satisfaction in either partner due to erection problems. A curved penis occurring with erection. The curve may cause pain, or the penis may be too curved to allow for intercourse. Never having nighttime or morning erections. How is this diagnosed? This condition is often diagnosed by: Performing a physical exam to find other diseases or specific problems with the penis. Asking you detailed questions about the problem. Doing tests,  such as: Blood tests to check for diabetes mellitus or high cholesterol, or to measure hormone levels. Other tests to check for underlying health conditions. An ultrasound exam to check for scarring. A test to check blood flow to the penis. Doing a sleep study at home to measure nighttime erections. How is this treated? This condition may be treated by: Medicines, such as: Medicine taken by mouth to help you achieve an erection (oral medicine). Hormone replacement therapy to replace low testosterone levels. Medicine that is injected into the penis. Your health care provider may instruct you how to give yourself these injections at home. Medicine that is delivered with a short applicator tube. The tube is inserted into the opening at the tip of the penis, which is the opening of the urethra. A tiny pellet of medicine is put in the urethra. The pellet dissolves and enhances erectile function. This is also called MUSE (medicated urethral system for erections) therapy. Vacuum pump. This is a pump with a ring on it. The pump and ring are placed on the penis and used to create pressure that helps the penis become erect. Penile implant surgery. In this procedure, you may receive: An inflatable implant. This consists of cylinders, a pump, and a reservoir. The cylinders can be inflated with a fluid that helps to create an erection, and they can be deflated after intercourse. A semi-rigid implant. This consists of two silicone rubber rods. The rods provide some rigidity. They are also flexible, so the penis can both curve downward in its normal position and become straight for sexual intercourse. Blood vessel surgery to improve blood flow to the penis. During this procedure, a blood vessel from a different part of the body is placed into the penis to allow blood to flow around (bypass) damaged or blocked blood vessels. Lifestyle changes,  such as exercising more, losing weight, and quitting smoking. Follow  these instructions at home: Medicines  Take over-the-counter and prescription medicines only as told by your health care provider. Do not increase the dosage without first discussing it with your health care provider. If you are using self-injections, do injections as directed by your health care provider. Make sure you avoid any veins that are on the surface of the penis. After giving an injection, apply pressure to the injection site for 5 minutes. Talk to your health care provider about how to prevent headaches while taking ED medicines. These medicines may cause a sudden headache due to the increase in blood flow in your body. General instructions Exercise regularly, as directed by your health care provider. Work with your health care provider to lose weight, if needed. Do not use any products that contain nicotine or tobacco. These products include cigarettes, chewing tobacco, and vaping devices, such as e-cigarettes. If you need help quitting, ask your health care provider. Before using a vacuum pump, read the instructions that come with the pump and discuss any questions with your health care provider. Keep all follow-up visits. This is important. Contact a health care provider if: You feel nauseous. You are vomiting. You get sudden headaches while taking ED medicines. You have any concerns about your sexual health. Get help right away if: You are taking oral or injectable medicines and you have an erection that lasts longer than 4 hours. If your health care provider is unavailable, go to the nearest emergency room for evaluation. An erection that lasts much longer than 4 hours can result in permanent damage to your penis. You have severe pain in your groin or abdomen. You develop redness or severe swelling of your penis. You have redness spreading at your groin or lower abdomen. You are unable to urinate. You experience chest pain or a rapid heartbeat (palpitations) after taking oral  medicines. These symptoms may represent a serious problem that is an emergency. Do not wait to see if the symptoms will go away. Get medical help right away. Call your local emergency services (911 in the [place] Do not drive yourself to the hospital. Summary Erectile dysfunction (ED) is the inability to get or keep an erection during sexual intercourse. This condition is diagnosed based on a physical exam, your symptoms, and tests to determine the cause. Treatment varies depending on the cause and may include medicines, hormone therapy, surgery, or a vacuum pump. You may need follow-up visits to make sure that you are using your medicines or devices correctly. Get help right away if you are taking or injecting medicines and you have an erection that lasts longer than 4 hours. This information is not intended to replace advice given to you by your health care provider. Make sure you discuss any questions you have with your health care provider. Document Revised: 03/03/2021 Document Reviewed: 03/03/2021 Elsevier Patient Education  [employer].  Hypogonadism, Male  Male hypogonadism is a condition of having a level of testosterone that is lower than normal. Testosterone is a chemical, or hormone, that is made mainly in the testicles. In boys, testosterone is responsible for the development of male characteristics during puberty. These include: Making the penis bigger. Growing and building the muscles. Growing facial hair. Deepening the voice. In adult men, testosterone is responsible for maintaining: An interest in sex and the ability to have sex. Muscle mass. Sperm production. Red blood cell production. Bone strength. Testosterone also gives men energy  and a sense of well-being. Testosterone normally decreases as men age and the testicles make less testosterone. Testosterone levels can vary from man to man. Not all men will have signs and symptoms of low testosterone. Weight, alcohol  use, medicines, and certain medical conditions can affect a man's testosterone level. What are the causes? This condition is caused by: A natural decrease in testosterone that occurs as a man grows older. This is the main cause of this condition. Use of medicines, such as antidepressants, steroids, and opioids. Diseases and conditions that affect the testicles or the making of testosterone. These include: Injury or damage to the testicles from trauma, cancer, cancer treatment, or infection. Diabetes. Sleep apnea. Genetic conditions that men are born with. Disease of the pituitary gland. This gland is in the brain. It produces hormones. Obesity. Metabolic syndrome. This is a group of diseases that affect blood pressure, blood sugar, cholesterol, and belly fat. HIV or AIDS. Alcohol abuse. Kidney failure. Other long-term or chronic diseases. What are the signs or symptoms? Common symptoms of this condition include: Loss of interest in sex (low sex drive). Inability to have or maintain an erection (erectile dysfunction). Feeling tired (fatigue). Mood changes, like irritability or depression. Loss of muscle and body hair. Infertility. Large breasts. Weight gain (obesity). How is this diagnosed? Your health care provider can diagnose hypogonadism based on: Your signs and symptoms. A physical exam to check your testosterone levels. This includes blood tests. Testosterone levels can change throughout the day. Levels are highest in the morning. You may need to have repeat blood tests before getting a diagnosis of hypogonadism. Depending on your medical history and test results, your health care provider may also do other tests to find the cause of low testosterone. How is this treated? This condition is treated with testosterone replacement therapy. Testosterone can be given by: Injection or through pellets inserted under the skin. Gels or patches placed on the skin or in the  mouth. Testosterone therapy is not for everyone. It has risks and side effects. Your health care provider will consider your medical history, your risk for prostate cancer, your age, and your symptoms before putting you on testosterone replacement therapy. Follow these instructions at home: Take over-the-counter and prescription medicines only as told by your health care provider. Eat foods that are high in fiber, such as beans, whole grains, and fresh fruits and vegetables. Limit foods that are high in fat and processed sugars, such as fried or sweet foods. If you drink alcohol: Limit how much you have to 0-2 drinks a day. Know how much alcohol is in your drink. In the [place] one drink equals one 12 oz bottle of beer (355 mL), one 5 oz glass of wine (148 mL), or one 1 oz glass of hard liquor (44 mL). Return to your normal activities as told by your health care provider. Ask your health care provider what activities are safe for you. Keep all follow-up visits. This is important. Contact a health care provider if: You have any of the signs or symptoms of low testosterone. You have any side effects from testosterone therapy. Summary Male hypogonadism is a condition of having a level of testosterone that is lower than normal. The natural drop in testosterone production that occurs with age is the most common cause of this condition. Low testosterone can also be caused by many diseases and conditions that affect the testicles and the making of testosterone. This condition is treated with testosterone replacement therapy. There  are risks and side effects of testosterone therapy. Your health care provider will consider your age, medical history, symptoms, and risks for prostate cancer before putting you on testosterone therapy. This information is not intended to replace advice given to you by your health care provider. Make sure you discuss any questions you have with your health care  provider. Document Revised: 08/06/2020 Document Reviewed: 08/06/2020 Elsevier Patient Education  [employer].

## 2023-02-08 NOTE — Progress Notes (Signed)
   02/08/2023 10:34 AM   Sinclair Grooms. [birth year] [id number]  Reason for visit: Follow up ED, low testosterone  HPI: 57 year old male who I originally saw in January 2023 for the above issues.  He was also having some scrotal swelling at the base of the penis at that time after undergoing wave tach ED treatments in [place].  At that time we started him on Cialis 5 mg every other day for his ED, and he requested follow-up as needed.  He also has a history of low testosterone, and previously was on injections through his PCP, but developed elevated hematocrit and those were discontinued.  He initially had significant improvement on the Cialis 5 mg every other day, but this has decreased in effectiveness over the last 6 months.  I recommended increasing the dose to 10 mg daily, with an additional 10 mg boost dose 1 hour prior to sexual activity.  I also recommended a repeat testosterone today, and will call with those results.  We could consider Clomid if testosterone low which may help with both that his energy/fatigue and ED.  Cialis changed to 10 mg daily with 10 mg boost dose as needed Testosterone today, call with results, consider Clomid if low(developed elevated hematocrit on testosterone injections previously) He request 6-week follow-up for symptom check  Billey Co, [facility] [address], [place], [place] [address] [phone]

## 2023-02-10 LAB — TESTOSTERONE: Testosterone: 366 ng/dL (ref 264–916)

## 2023-02-14 ENCOUNTER — Telehealth: Payer: Self-pay

## 2023-02-14 NOTE — Telephone Encounter (Signed)
Called pt, no answer. Unable to leave message, 1st attempt.

## 2023-02-14 NOTE — Telephone Encounter (Signed)
-----   Message from Billey Co, MD sent at 02/13/2023  7:41 AM EST ----- Testosterone within the normal range, keep follow-up as scheduled  Nickolas Madrid, MD 02/13/2023

## 2023-02-15 NOTE — Telephone Encounter (Signed)
Called pt informed him of the information below. Pt voiced understanding.

## 2023-03-28 ENCOUNTER — Ambulatory Visit (INDEPENDENT_AMBULATORY_CARE_PROVIDER_SITE_OTHER): Payer: BC Managed Care – PPO | Admitting: Urology

## 2023-03-28 ENCOUNTER — Encounter: Payer: Self-pay | Admitting: Urology

## 2023-03-28 VITALS — BP 121/75 | HR 71 | Ht 69.0 in | Wt 233.0 lb

## 2023-03-28 DIAGNOSIS — N529 Male erectile dysfunction, unspecified: Secondary | ICD-10-CM

## 2023-03-28 MED ORDER — TADALAFIL 10 MG PO TABS: 10.0000 mg | ORAL_TABLET | Freq: Every day | ORAL | 3 refills | Status: DC

## 2023-03-28 NOTE — Progress Notes (Signed)
   03/28/2023 2:20 PM   Hector Ramp Jr. 02-12-1966 [id number]  Reason for visit: Follow up ED, history of low testosterone  HPI: 57 year old male I have followed for the above issues.  He actually underwent wave tachycardia ED treatments in [place] in 2022 and was having some scrotal swelling at the base of the penis that resolved after a few months.  Currently on Cialis 10 mg daily with 10 mg boost with good results.  He also has a history of low testosterone previously on injections through PCP, but developed elevated hematocrit and those were discontinued.  We checked a testosterone in February 2024 which was normal at 366.  He has worked on weight loss and exercise and continues to feel better and has lost 10 pounds in the last month.  He denies any sleep apnea symptoms like snoring, breath-holding spells, or daytime fatigue.  I encouraged him to continue healthy eating and exercise  Continue Cialis 10 mg daily with 10 mg boost for ED, refilled  RTC 1 year symptom check   Sondra Come, MD  [facility] [address], [address] [place], [place] [address] [phone]

## 2023-03-28 NOTE — Patient Instructions (Signed)
The Benefits of a Plant-Based Diet for Urology Health  A plant-based diet emphasizes the consumption of whole, unprocessed plant foods while minimizing or excluding animal products including meat and dairy products. This dietary approach has gained attention for its potential to promote overall health, including urology-related conditions. Incorporating a plant-based diet into your lifestyle can offer numerous benefits for maintaining optimal urology health.  1. Reduced Risk of Kidney Stones: A plant-based diet is typically rich in fruits, vegetables, legumes, and whole grains. These foods are high in dietary fiber, potassium, and magnesium, which can help reduce the risk of developing kidney stones. Be careful to avoid high quantities of spinach, as these can contribute to kidney stone formation if eaten in large volumes. The increased intake of water-soluble fiber can enhance the excretion of waste products and prevent the crystallization of minerals that lead to stone formation.  2. Improved Prostate Health: Studies have suggested a link between the consumption of red and processed meats and an increased risk of prostate problems, including benign prostatic hyperplasia (BPH) and prostate cancer. By adopting a plant-based diet, you can lower your intake of saturated fats and decrease the risk of these conditions. PSA levels can often decrease on plant based diets! Plant foods are also rich in antioxidants and phytochemicals that have been associated with prostate health.  3. Better Bladder Function: A diet focused on plant-based foods can contribute to better bladder health by reducing the risk of urinary tract infections (UTIs). Berries, citrus fruits, and leafy greens are known for their high vitamin C content, which can acidify urine and create an environment less favorable for bacteria growth. Additionally, plant-based diets are generally lower in sodium, which can help prevent fluid retention and  reduce the strain on the bladder.  4. Management of Erectile Dysfunction (ED): Some research suggests that a plant-based diet can positively impact erectile function. Plant-based diets are associated with improved cardiovascular health, which is crucial for maintaining healthy blood flow and nerve function required for proper erectile function. By reducing the consumption of high-cholesterol and high-saturated fat animal products, a plant-based diet may contribute to a decreased risk of ED.  5. Prevention of Chronic Conditions: A plant-based diet can help prevent or manage chronic conditions such as obesity, diabetes, and hypertension. These conditions can contribute to urology-related issues, including urinary incontinence and kidney dysfunction. By maintaining a healthy weight and managing these conditions, you can reduce the risk of urology-related complications.  Conclusion: Embracing a plant-based diet can offer significant benefits for urology health. By incorporating a variety of colorful fruits, vegetables, whole grains, nuts, seeds, and legumes into your meals, you can support kidney health, prostate health, bladder function, and overall well-being. Remember to consult with a healthcare professional or registered dietitian before making any significant dietary changes, especially if you have existing health conditions. Your personalized approach to a plant-based diet can contribute to improved urology health and enhance your quality of life.      

## 2023-08-09 ENCOUNTER — Ambulatory Visit: Payer: BC Managed Care – PPO | Admitting: Urology

## 2024-03-19 ENCOUNTER — Encounter: Payer: Self-pay | Admitting: Oncology

## 2024-03-20 ENCOUNTER — Encounter: Payer: Self-pay | Admitting: Oncology

## 2024-03-26 ENCOUNTER — Ambulatory Visit: Payer: Self-pay | Admitting: Urology

## 2024-04-09 ENCOUNTER — Ambulatory Visit (INDEPENDENT_AMBULATORY_CARE_PROVIDER_SITE_OTHER): Payer: Self-pay | Admitting: Urology

## 2024-04-09 ENCOUNTER — Encounter: Payer: Self-pay | Admitting: Oncology

## 2024-04-09 ENCOUNTER — Encounter: Payer: Self-pay | Admitting: Urology

## 2024-04-09 VITALS — BP 128/79 | HR 63 | Ht 69.0 in | Wt 250.0 lb

## 2024-04-09 DIAGNOSIS — N529 Male erectile dysfunction, unspecified: Secondary | ICD-10-CM | POA: Diagnosis not present

## 2024-04-09 MED ORDER — TADALAFIL 10 MG PO TABS: 10.0000 mg | ORAL_TABLET | Freq: Every day | ORAL | 4 refills | Status: AC

## 2024-04-09 NOTE — Progress Notes (Signed)
   04/09/2024 1:36 PM   Hector Hess Jr. 1966/07/11 [id number]  Reason for visit: Follow up ED, history of low testosterone   HPI: 58 year old male I have followed for the above issues.  He actually underwent wave ED treatments in [place] in 2022 and was having some scrotal swelling at the base of the penis that resolved after a few months.  Currently on Cialis  10 mg daily with good results.  He also has a history of low testosterone  previously on injections through PCP, but developed elevated hematocrit and those were discontinued.  We checked a testosterone  in February 2024 which was normal at 366.  He has worked on weight loss and exercise and continues to feel better.  He denies any urinary complaints today.  Continue Cialis  10 mg daily, can use 10 mg boost dose if needed  Cialis  refilled, can be filled by PCP moving forward, follow-up with urology as   Lawerence Pressman, MD  [facility] [address], [address] [place], [place] [address] [phone]
